# Patient Record
Sex: Female | Born: 1980 | Race: White | Hispanic: No | Marital: Married | Smoking: Never smoker
Health system: Southern US, Community
[De-identification: ages and names within clinical notes are randomized; demographics above are authoritative.]

## PROBLEM LIST (undated history)

## (undated) DIAGNOSIS — Z8614 Personal history of Methicillin resistant Staphylococcus aureus infection: Secondary | ICD-10-CM

## (undated) DIAGNOSIS — S52502A Unspecified fracture of the lower end of left radius, initial encounter for closed fracture: Secondary | ICD-10-CM

## (undated) HISTORY — PX: CHOLECYSTECTOMY: SHX55

## (undated) HISTORY — PX: WISDOM TOOTH EXTRACTION: SHX21

---
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
## 2000-01-02 ENCOUNTER — Other Ambulatory Visit: Admission: RE | Admit: 2000-01-02 | Discharge: 2000-01-02 | Payer: Self-pay | Admitting: Family Medicine

## 2000-01-10 ENCOUNTER — Ambulatory Visit (HOSPITAL_COMMUNITY): Admission: RE | Admit: 2000-01-10 | Discharge: 2000-01-10 | Payer: Self-pay | Admitting: Family Medicine

## 2000-01-10 ENCOUNTER — Encounter: Payer: Self-pay | Admitting: Family Medicine

## 2000-02-13 ENCOUNTER — Ambulatory Visit (HOSPITAL_COMMUNITY): Admission: RE | Admit: 2000-02-13 | Discharge: 2000-02-13 | Payer: Self-pay | Admitting: Family Medicine

## 2000-02-13 ENCOUNTER — Encounter: Payer: Self-pay | Admitting: Family Medicine

## 2000-03-28 ENCOUNTER — Ambulatory Visit (HOSPITAL_COMMUNITY): Admission: RE | Admit: 2000-03-28 | Discharge: 2000-03-28 | Payer: Self-pay | Admitting: Family Medicine

## 2000-03-28 ENCOUNTER — Encounter: Payer: Self-pay | Admitting: Family Medicine

## 2000-04-21 ENCOUNTER — Ambulatory Visit (HOSPITAL_COMMUNITY): Admission: RE | Admit: 2000-04-21 | Discharge: 2000-04-21 | Payer: Self-pay | Admitting: Family Medicine

## 2000-04-21 ENCOUNTER — Encounter: Payer: Self-pay | Admitting: Family Medicine

## 2000-05-12 ENCOUNTER — Encounter: Payer: Self-pay | Admitting: Family Medicine

## 2000-05-12 ENCOUNTER — Ambulatory Visit (HOSPITAL_COMMUNITY): Admission: RE | Admit: 2000-05-12 | Discharge: 2000-05-12 | Payer: Self-pay | Admitting: Family Medicine

## 2000-06-05 ENCOUNTER — Encounter: Payer: Self-pay | Admitting: Family Medicine

## 2000-06-05 ENCOUNTER — Encounter (HOSPITAL_COMMUNITY): Admission: RE | Admit: 2000-06-05 | Discharge: 2000-06-25 | Payer: Self-pay | Admitting: Family Medicine

## 2000-06-05 ENCOUNTER — Observation Stay (HOSPITAL_COMMUNITY): Admission: AD | Admit: 2000-06-05 | Discharge: 2000-06-06 | Payer: Self-pay | Admitting: Family Medicine

## 2000-06-06 ENCOUNTER — Encounter: Payer: Self-pay | Admitting: Family Medicine

## 2000-06-23 ENCOUNTER — Inpatient Hospital Stay (HOSPITAL_COMMUNITY): Admission: AD | Admit: 2000-06-23 | Discharge: 2000-06-26 | Payer: Self-pay | Admitting: Family Medicine

## 2001-06-24 HISTORY — PX: TUBAL LIGATION: SHX77

## 2001-07-15 ENCOUNTER — Other Ambulatory Visit: Admission: RE | Admit: 2001-07-15 | Discharge: 2001-07-15 | Payer: Self-pay | Admitting: Family Medicine

## 2001-12-04 ENCOUNTER — Ambulatory Visit (HOSPITAL_COMMUNITY): Admission: RE | Admit: 2001-12-04 | Discharge: 2001-12-04 | Payer: Self-pay | Admitting: Family Medicine

## 2001-12-04 ENCOUNTER — Encounter: Payer: Self-pay | Admitting: Family Medicine

## 2002-01-11 ENCOUNTER — Ambulatory Visit (HOSPITAL_COMMUNITY): Admission: RE | Admit: 2002-01-11 | Discharge: 2002-01-11 | Payer: Self-pay | Admitting: Family Medicine

## 2002-01-11 ENCOUNTER — Encounter: Payer: Self-pay | Admitting: Family Medicine

## 2002-06-05 ENCOUNTER — Inpatient Hospital Stay (HOSPITAL_COMMUNITY): Admission: AD | Admit: 2002-06-05 | Discharge: 2002-06-07 | Payer: Self-pay | Admitting: Family Medicine

## 2002-07-30 ENCOUNTER — Other Ambulatory Visit: Admission: RE | Admit: 2002-07-30 | Discharge: 2002-07-30 | Payer: Self-pay | Admitting: Family Medicine

## 2004-08-14 ENCOUNTER — Ambulatory Visit (HOSPITAL_COMMUNITY): Admission: RE | Admit: 2004-08-14 | Discharge: 2004-08-14 | Payer: Self-pay | Admitting: Family Medicine

## 2004-08-28 ENCOUNTER — Ambulatory Visit (HOSPITAL_COMMUNITY): Admission: RE | Admit: 2004-08-28 | Discharge: 2004-08-29 | Payer: Self-pay | Admitting: General Surgery

## 2006-02-15 ENCOUNTER — Emergency Department (HOSPITAL_COMMUNITY): Admission: EM | Admit: 2006-02-15 | Discharge: 2006-02-15 | Payer: Self-pay | Admitting: Emergency Medicine

## 2006-05-27 ENCOUNTER — Emergency Department (HOSPITAL_COMMUNITY): Admission: EM | Admit: 2006-05-27 | Discharge: 2006-05-27 | Payer: Self-pay | Admitting: Emergency Medicine

## 2006-10-12 ENCOUNTER — Emergency Department (HOSPITAL_COMMUNITY): Admission: EM | Admit: 2006-10-12 | Discharge: 2006-10-12 | Payer: Self-pay | Admitting: Emergency Medicine

## 2011-09-27 ENCOUNTER — Encounter: Payer: Self-pay | Admitting: Family Medicine

## 2011-09-27 ENCOUNTER — Ambulatory Visit (INDEPENDENT_AMBULATORY_CARE_PROVIDER_SITE_OTHER): Payer: Managed Care, Other (non HMO) | Admitting: Family Medicine

## 2011-09-27 VITALS — BP 114/82 | HR 91 | Resp 15 | Ht 66.0 in | Wt 236.0 lb

## 2011-09-27 DIAGNOSIS — F419 Anxiety disorder, unspecified: Secondary | ICD-10-CM | POA: Insufficient documentation

## 2011-09-27 DIAGNOSIS — G4700 Insomnia, unspecified: Secondary | ICD-10-CM | POA: Insufficient documentation

## 2011-09-27 DIAGNOSIS — F329 Major depressive disorder, single episode, unspecified: Secondary | ICD-10-CM | POA: Insufficient documentation

## 2011-09-27 DIAGNOSIS — F3289 Other specified depressive episodes: Secondary | ICD-10-CM

## 2011-09-27 DIAGNOSIS — F32A Depression, unspecified: Secondary | ICD-10-CM

## 2011-09-27 DIAGNOSIS — E669 Obesity, unspecified: Secondary | ICD-10-CM | POA: Insufficient documentation

## 2011-09-27 DIAGNOSIS — F411 Generalized anxiety disorder: Secondary | ICD-10-CM

## 2011-09-27 MED ORDER — CLONAZEPAM 0.5 MG PO TABS: 0.5000 mg | ORAL_TABLET | Freq: Two times a day (BID) | ORAL | Status: DC | PRN

## 2011-09-27 MED ORDER — ZOLPIDEM TARTRATE 10 MG PO TABS: 10.0000 mg | ORAL_TABLET | Freq: Every evening | ORAL | Status: DC | PRN

## 2011-09-27 MED ORDER — VENLAFAXINE HCL 37.5 MG PO TABS: 37.5000 mg | ORAL_TABLET | Freq: Two times a day (BID) | ORAL | Status: DC

## 2011-09-27 NOTE — Patient Instructions (Signed)
For your anxiety and nerves start the effexor twice a day Use the klonopin as needed if feeling overwhelmed AMbein 1 hour before bedtime for sleep Do not eat after midnight for your physical exam, labs  Will be drawn Schedule a Physical Exam within the next 4 weeks- will f/u medications at that time

## 2011-09-27 NOTE — Assessment & Plan Note (Signed)
We discussed that changing her diet will be the biggest thing is she has difficulty finding time to exercise. She will have labs done at her physical exam within the next few weeks and will obtain fasting lipid panel at that time

## 2011-09-27 NOTE — Assessment & Plan Note (Signed)
Likely multifactorial from swing shift as well as her depression/anxiety

## 2011-09-27 NOTE — Progress Notes (Signed)
  Subjective:    Patient ID: Claire Anderson, female    DOB: 12/23/1980, 31 y.o.   MRN: [id number]  HPI  Patient here to establish care. She has not had a previous PCP in greater than 3years. Medications and history reviewed  Depression/anxiety- she has a history of depression and anxiety. In the past she was treated for depression on Celexa and Zoloft a short period of time however this did not help. She did difficult time dealing with the death of her grandfather. Currently she feels very overwhelmed and feels like she needs medication to help her. 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 Her husband was hurt on the job and was also diagnosed with bipolar therefore is now not working and she is the sole breadwinner. They have 3 children she is also caring for as well as working full time. She never expected to have her take on so many responsibilities. She works swing shift therefore has difficulty sleeping. She does note that she has coffee at night and typically snacks while on the computer however takes her a few hours to fall asleep. Her mother and grandmother are helping to support them financially. She has tried Benadryl to help her sleep.  Obesity- in the past she was seen a weight loss physician and was on Avonex as well as hCG shot and a meal plan. She was 53 pounds before starting LPN school 2 years ago however gained it back in approx 1 year.   Overdue for Pap smear History of preeclampsia 13 years ago  Review of Systems   GEN- denies fatigue, fever, weight loss,weakness, recent illness HEENT- denies eye drainage, change in vision, nasal discharge, CVS- denies chest pain, palpitations RESP- denies SOB, cough, wheeze ABD- denies N/V, change in stools, abd pain GU- denies dysuria, hematuria, dribbling, incontinence MSK- denies joint pain, muscle aches, injury, +restless  legs Neuro- denies headache, dizziness, syncope, seizure activity       Objective:   Physical Exam GEN- NAD, alert and oriented x3, obese HEENT- PERRL, EOMI, non injected sclera, pink conjunctiva, MMM, oropharynx clear Neck- Supple, no thyromegaly CVS- RRR, no murmur RESP-CTAB EXT- No edema Pulses- Radial, DP- 2+ Psych- not depressed appearing, normal affect, not overly anxious appearing, normal thought process, normal speech, no apparent SI       Assessment & Plan:

## 2011-09-27 NOTE — Assessment & Plan Note (Signed)
Claire Anderson she is coping quite well the setting of having to take on so much responsibility in the past couple of years. We'll start Effexor to cover both depression and anxiety symptoms. Low-dose Klonopin to be used for bridge

## 2011-10-25 ENCOUNTER — Encounter: Payer: Self-pay | Admitting: Family Medicine

## 2011-10-25 ENCOUNTER — Other Ambulatory Visit (HOSPITAL_COMMUNITY)
Admission: RE | Admit: 2011-10-25 | Discharge: 2011-10-25 | Disposition: A | Discharge status: Home / Self Care | Payer: Managed Care, Other (non HMO) | Source: Ambulatory Visit | Attending: Family Medicine | Admitting: Family Medicine

## 2011-10-25 ENCOUNTER — Ambulatory Visit (INDEPENDENT_AMBULATORY_CARE_PROVIDER_SITE_OTHER): Payer: Managed Care, Other (non HMO) | Admitting: Family Medicine

## 2011-10-25 VITALS — BP 112/78 | HR 73 | Resp 16 | Ht 66.0 in | Wt 226.1 lb

## 2011-10-25 DIAGNOSIS — Z0000 Encounter for general adult medical examination without abnormal findings: Secondary | ICD-10-CM

## 2011-10-25 DIAGNOSIS — F419 Anxiety disorder, unspecified: Secondary | ICD-10-CM

## 2011-10-25 DIAGNOSIS — Z124 Encounter for screening for malignant neoplasm of cervix: Secondary | ICD-10-CM

## 2011-10-25 DIAGNOSIS — F32A Depression, unspecified: Secondary | ICD-10-CM

## 2011-10-25 DIAGNOSIS — E669 Obesity, unspecified: Secondary | ICD-10-CM

## 2011-10-25 DIAGNOSIS — F411 Generalized anxiety disorder: Secondary | ICD-10-CM

## 2011-10-25 DIAGNOSIS — F329 Major depressive disorder, single episode, unspecified: Secondary | ICD-10-CM

## 2011-10-25 DIAGNOSIS — F3289 Other specified depressive episodes: Secondary | ICD-10-CM

## 2011-10-25 DIAGNOSIS — Z01419 Encounter for gynecological examination (general) (routine) without abnormal findings: Secondary | ICD-10-CM | POA: Insufficient documentation

## 2011-10-25 DIAGNOSIS — Z23 Encounter for immunization: Secondary | ICD-10-CM

## 2011-10-25 LAB — CBC
HCT: 39.6 % (ref 36.0–46.0)
MCHC: 32.1 g/dL (ref 30.0–36.0)
MCV: 89.4 fL (ref 78.0–100.0)
Platelets: 183 10*3/uL (ref 150–400)
RDW: 13.2 % (ref 11.5–15.5)

## 2011-10-25 LAB — COMPREHENSIVE METABOLIC PANEL
ALT: 11 U/L (ref 0–35)
AST: 11 U/L (ref 0–37)
CO2: 27 mEq/L (ref 19–32)
Calcium: 8.8 mg/dL (ref 8.4–10.5)
Chloride: 103 mEq/L (ref 96–112)
Creat: 0.56 mg/dL (ref 0.50–1.10)
Sodium: 138 mEq/L (ref 135–145)
Total Protein: 6.4 g/dL (ref 6.0–8.3)

## 2011-10-25 LAB — LIPID PANEL
LDL Cholesterol: 72 mg/dL (ref 0–99)
Triglycerides: 101 mg/dL (ref <150)
VLDL: 20 mg/dL (ref 0–40)

## 2011-10-25 MED ORDER — CLONAZEPAM 0.5 MG PO TABS: 0.5000 mg | ORAL_TABLET | Freq: Three times a day (TID) | ORAL | Status: DC | PRN

## 2011-10-25 NOTE — Patient Instructions (Signed)
Get your blood work done fasting, we will call with results Given TDAP today Increase your klonopin to three times a day as needed Continue effexor  I recommend eye visit once a year I recommend dental visit every 6 months 30 minutes of exercise 5 days  A week F/U 3 months

## 2011-10-27 ENCOUNTER — Encounter: Payer: Self-pay | Admitting: Family Medicine

## 2011-10-27 NOTE — Assessment & Plan Note (Signed)
Mood improved, continue effexor

## 2011-10-27 NOTE — Progress Notes (Signed)
  Subjective:    Patient ID: Claire Anderson, female    DOB: Jul 09, 1980, 31 y.o.   MRN: [id number]  HPI  Pt here for GYN exam, PAP Smear done  LMP 1 week ago, married Meds reviewed Anxiety/depression- taking effexor, feels this has helped a lot, but she still has anxiety where at times she takes her klonopin three times a day or takes 2 at a time, she would like to know if this can be increased. She only uses ambien on her shift nights where her sleep is disturbed.    Review of Systems  GEN- denies fatigue, fever, weight loss,weakness, recent illness HEENT- denies eye drainage, change in vision, nasal discharge, CVS- denies chest pain, palpitations RESP- denies SOB, cough, wheeze ABD- denies N/V, change in stools, abd pain GU- denies dysuria, hematuria, dribbling, incontinence MSK- denies joint pain, muscle aches, injury Neuro- denies headache, dizziness, syncope, seizure activity Psych-+anxiety, +depression     Objective:   Physical Exam GEN- NAD, alert and orientedx 3, obese Neck- supple, no thyromegaly CVS-RRR, no murmur RESP-CTAB Breast- normal symmetry, no nipple inversion,no nipple drainage, no nodules or lumps felt Nodes- no axillary nodes GU- normal external genitalia, vaginal mucosa pink and moist, cervix visualized no growth, no blood form os, no discharge, no CMT, no ovarian masses, uterus normal size EXT- no edema Pulses-2+ radial and DP        Assessment & Plan:    GYN Exam- PAP Smear done, immunizations UTD- TDAP given  Labs to be done- including FLP

## 2011-10-27 NOTE — Assessment & Plan Note (Signed)
Improved but still has symptoms on very stressful days, will allow her to take TID

## 2011-10-27 NOTE — Assessment & Plan Note (Signed)
FLP, encouraged change in diet and activity Congratulated on weight loss

## 2011-11-26 ENCOUNTER — Other Ambulatory Visit: Payer: Self-pay | Admitting: Family Medicine

## 2011-11-27 ENCOUNTER — Other Ambulatory Visit: Payer: Self-pay | Admitting: Family Medicine

## 2011-12-11 ENCOUNTER — Other Ambulatory Visit: Payer: Self-pay | Admitting: Family Medicine

## 2012-01-23 ENCOUNTER — Other Ambulatory Visit: Payer: Self-pay | Admitting: Family Medicine

## 2012-01-31 ENCOUNTER — Ambulatory Visit (INDEPENDENT_AMBULATORY_CARE_PROVIDER_SITE_OTHER): Payer: Managed Care, Other (non HMO) | Admitting: Family Medicine

## 2012-01-31 ENCOUNTER — Encounter: Payer: Self-pay | Admitting: Family Medicine

## 2012-01-31 VITALS — BP 112/78 | HR 78 | Resp 16 | Ht 66.0 in | Wt 221.0 lb

## 2012-01-31 DIAGNOSIS — F3289 Other specified depressive episodes: Secondary | ICD-10-CM

## 2012-01-31 DIAGNOSIS — F411 Generalized anxiety disorder: Secondary | ICD-10-CM

## 2012-01-31 DIAGNOSIS — F32A Depression, unspecified: Secondary | ICD-10-CM

## 2012-01-31 DIAGNOSIS — F329 Major depressive disorder, single episode, unspecified: Secondary | ICD-10-CM

## 2012-01-31 DIAGNOSIS — E669 Obesity, unspecified: Secondary | ICD-10-CM

## 2012-01-31 DIAGNOSIS — F419 Anxiety disorder, unspecified: Secondary | ICD-10-CM

## 2012-01-31 MED ORDER — VENLAFAXINE HCL 75 MG PO TABS: 75.0000 mg | ORAL_TABLET | Freq: Two times a day (BID) | ORAL | Status: DC

## 2012-01-31 MED ORDER — PHENTERMINE HCL 37.5 MG PO TABS: 37.5000 mg | ORAL_TABLET | Freq: Every day | ORAL | Status: DC

## 2012-01-31 NOTE — Progress Notes (Signed)
  Subjective:    Patient ID: Claire Anderson, female    DOB: 1980-11-18, 31 y.o.   MRN: [id number]  HPI  PT here to f/u anxiety and weight.   Obesity- has lost 15lbs with diet, trying to increase activity, was on phentermine in the past, would like to retry Anxiety/depression- doing well until recently, her grandmother has been sick a lot in and out of hospital, her husband is having work-up for possible lung cancer, she is working and this is stressful and caring for children, she would like the effexor to be increased, feels it works for her    Review of Systems  GEN- denies fatigue, fever, weight loss,weakness, recent illness HEENT- denies eye drainage, change in vision, nasal discharge, CVS- denies chest pain, palpitations RESP- denies SOB, cough, wheeze ABD- denies N/V, change in stools, abd pain GU- denies dysuria, hematuria, dribbling, incontinence MSK- denies joint pain, muscle aches, injury Neuro- denies headache, dizziness, syncope, seizure activity      Objective:   Physical Exam GEN- NAD, alert and oriented x3 HEENT- PERRL, EOMI, non injected sclera, pink conjunctiva, MMM, oropharynx clear CVS- RRR, no murmur RESP-CTAB EXT- No edema Pulses- Radial, DP- 2+ Psych- normal affect and Mood, good eye contact, normal thought process and judgement       Assessment & Plan:

## 2012-01-31 NOTE — Patient Instructions (Signed)
For the effexor take 1 tablet in morning and 2 night x 1 week, then increase to 2 in morning and 2 at night When you run out get the new dose Start the phentermine in 2 weeks  F/U 6 weeks

## 2012-02-02 NOTE — Assessment & Plan Note (Signed)
Deteriorated in setting of recent events, titrate effexor to 75mg  BID see below instructions

## 2012-02-02 NOTE — Assessment & Plan Note (Signed)
Will plan to restart phentermine after her dose adjustment in Effexor, discussed SE including pulmonary, her weight also contributes to her depression but she is very motivated to lose weight

## 2012-02-02 NOTE — Assessment & Plan Note (Signed)
Pt declines therapy, has difficult with finances, no SI, increase effexor

## 2012-03-13 ENCOUNTER — Ambulatory Visit: Payer: Managed Care, Other (non HMO) | Admitting: Family Medicine

## 2012-03-18 ENCOUNTER — Other Ambulatory Visit: Payer: Self-pay | Admitting: Family Medicine

## 2012-03-27 ENCOUNTER — Ambulatory Visit: Payer: Managed Care, Other (non HMO) | Admitting: Family Medicine

## 2012-04-24 ENCOUNTER — Encounter: Payer: Self-pay | Admitting: Family Medicine

## 2012-04-24 ENCOUNTER — Ambulatory Visit (INDEPENDENT_AMBULATORY_CARE_PROVIDER_SITE_OTHER): Payer: Managed Care, Other (non HMO) | Admitting: Family Medicine

## 2012-04-24 VITALS — BP 110/80 | HR 82 | Resp 15 | Ht 66.0 in | Wt 221.0 lb

## 2012-04-24 DIAGNOSIS — F419 Anxiety disorder, unspecified: Secondary | ICD-10-CM

## 2012-04-24 DIAGNOSIS — F3289 Other specified depressive episodes: Secondary | ICD-10-CM

## 2012-04-24 DIAGNOSIS — F411 Generalized anxiety disorder: Secondary | ICD-10-CM

## 2012-04-24 DIAGNOSIS — F329 Major depressive disorder, single episode, unspecified: Secondary | ICD-10-CM

## 2012-04-24 DIAGNOSIS — G4700 Insomnia, unspecified: Secondary | ICD-10-CM

## 2012-04-24 DIAGNOSIS — E669 Obesity, unspecified: Secondary | ICD-10-CM

## 2012-04-24 DIAGNOSIS — F32A Depression, unspecified: Secondary | ICD-10-CM

## 2012-04-24 MED ORDER — VENLAFAXINE HCL 75 MG PO TABS: 75.0000 mg | ORAL_TABLET | Freq: Two times a day (BID) | ORAL | Status: DC

## 2012-04-24 MED ORDER — PHENTERMINE HCL 37.5 MG PO TABS: 37.5000 mg | ORAL_TABLET | Freq: Every day | ORAL | Status: DC

## 2012-04-24 MED ORDER — ALPRAZOLAM 0.5 MG PO TABS: ORAL_TABLET | ORAL | Status: DC

## 2012-04-24 NOTE — Assessment & Plan Note (Signed)
Improved, with use of melatonin, she is decreasing use of [place]

## 2012-04-24 NOTE — Assessment & Plan Note (Signed)
She is improving and handling situations much better. Scores have also improved Continue effexor at current dose

## 2012-04-24 NOTE — Patient Instructions (Signed)
Try xanax 1-2 tablets  Continue effexor Continue phentermine  Handout of foods  Continue your exercise and watching your diets  F./U 4 months

## 2012-04-24 NOTE — Progress Notes (Signed)
  Subjective:    Patient ID: Claire Anderson, female    DOB: [birth year], 31 y.o.   MRN: [id number]  HPI Patient here to follow chronic medical problems. Including depression anxiety insomnia no PCP. She states that the increased dose of Effexor is working well for her., And is not helping very much for her panic and anxiety attacks. She often has to take 2 tablets at once. She is sleeping pretty well. She is actually add melatonin therefore uses Ambien very rarely. She had a situation at work very well yesterday and was very proud of herself and was also noted by her charge nurse. She's been trying to watch her diet but has noticed that she has been eating to many sweets. She's also drinking more caffeine because of her shift work. She has lost 15 pounds since April and is disappointed in herself because she has not lost any further weight. She has been on phentermine for 6 weeks.   Review of Systems  GEN- denies fatigue, fever, weight loss,weakness, recent illness HEENT- denies eye drainage, change in vision, nasal discharge, CVS- denies chest pain, palpitations RESP- denies SOB, cough, wheeze ABD- denies N/V, change in stools, abd pain GU- denies dysuria, hematuria, dribbling, incontinence MSK- denies joint pain, muscle aches, injury Neuro- denies headache, dizziness, syncope, seizure activity      Objective:   Physical Exam  GEN- NAD, alert and oriented x3 CVS- RRR, no murmur RESP-CTAB EXT- No edema Pulses- Radial 2+ Psych- normal affect and Mood, good eye contact, normal thought process and judgement  PHQ-9 score 7      Assessment & Plan:

## 2012-04-24 NOTE — Assessment & Plan Note (Signed)
Weight down total of 15 lbs, exercises 3 times a week, will continue with phetermine Discussed cutting out soda, caffiene, junk food, adding fruit for sugar cravings

## 2012-04-24 NOTE — Assessment & Plan Note (Signed)
Change to Xanax for faster onset, 1-2 tablets as needed , she has been very compliant with medications

## 2012-06-21 ENCOUNTER — Other Ambulatory Visit: Payer: Self-pay | Admitting: Family Medicine

## 2012-06-22 ENCOUNTER — Other Ambulatory Visit: Payer: Self-pay | Admitting: Family Medicine

## 2012-07-07 ENCOUNTER — Emergency Department (HOSPITAL_COMMUNITY)
Admission: EM | Admit: 2012-07-07 | Discharge: 2012-07-07 | Disposition: A | Discharge status: Home / Self Care | Payer: Managed Care, Other (non HMO) | Attending: Emergency Medicine | Admitting: Emergency Medicine

## 2012-07-07 ENCOUNTER — Encounter (HOSPITAL_COMMUNITY): Payer: Self-pay | Admitting: *Deleted

## 2012-07-07 DIAGNOSIS — R0982 Postnasal drip: Secondary | ICD-10-CM | POA: Insufficient documentation

## 2012-07-07 DIAGNOSIS — J029 Acute pharyngitis, unspecified: Secondary | ICD-10-CM | POA: Insufficient documentation

## 2012-07-07 DIAGNOSIS — R509 Fever, unspecified: Secondary | ICD-10-CM | POA: Insufficient documentation

## 2012-07-07 DIAGNOSIS — R059 Cough, unspecified: Secondary | ICD-10-CM | POA: Insufficient documentation

## 2012-07-07 DIAGNOSIS — F411 Generalized anxiety disorder: Secondary | ICD-10-CM | POA: Insufficient documentation

## 2012-07-07 DIAGNOSIS — Z79899 Other long term (current) drug therapy: Secondary | ICD-10-CM | POA: Insufficient documentation

## 2012-07-07 DIAGNOSIS — Z8659 Personal history of other mental and behavioral disorders: Secondary | ICD-10-CM | POA: Insufficient documentation

## 2012-07-07 DIAGNOSIS — B349 Viral infection, unspecified: Secondary | ICD-10-CM

## 2012-07-07 DIAGNOSIS — B9789 Other viral agents as the cause of diseases classified elsewhere: Secondary | ICD-10-CM | POA: Insufficient documentation

## 2012-07-07 DIAGNOSIS — R05 Cough: Secondary | ICD-10-CM | POA: Insufficient documentation

## 2012-07-07 DIAGNOSIS — J3489 Other specified disorders of nose and nasal sinuses: Secondary | ICD-10-CM | POA: Insufficient documentation

## 2012-07-07 NOTE — ED Notes (Signed)
Flu-like sx x3 days.

## 2012-07-07 NOTE — ED Provider Notes (Signed)
Medical screening examination/treatment/procedure(s) were performed by non-physician practitioner and as supervising physician I was immediately available for consultation/collaboration.   Charles B. Bernette Mayers, MD 07/07/12 1257

## 2012-07-07 NOTE — ED Provider Notes (Signed)
History     CSN: [id number]  Arrival date & time 07/07/12  6578   First MD Initiated Contact with Patient 07/07/12 [phone]      Chief Complaint  Patient presents with  . Influenza    (Consider location/radiation/quality/duration/timing/severity/associated sxs/prior treatment) HPI Comments: + subj fever, NP cough, runny nose and sore throat.  "i think i have the flu".  Patient is a 32 y.o. female presenting with flu symptoms. The history is provided by the patient. No language interpreter was used.  Influenza This is a new problem. Episode onset: 3 days ago. The problem has been unchanged. Associated symptoms include coughing, a fever and a sore throat. Pertinent negatives include no chest pain, chills or rash. She has tried nothing for the symptoms.    Past Medical History  Diagnosis Date  . Depression   . Anxiety     Past Surgical History  Procedure Date  . Cholecystectomy   . Tubal ligation     Family History  Problem Relation Age of Onset  . Alcohol abuse Father   . Depression Mother   . Cancer Maternal Aunt     ovarian  . Hypertension Maternal Grandmother   . Hypertension Maternal Grandfather     History  Substance Use Topics  . Smoking status: Never Smoker   . Smokeless tobacco: Not on file  . Alcohol Use: No    OB History    Grav Para Term Preterm Abortions TAB SAB Ect Mult Living                  Review of Systems  Constitutional: Positive for fever. Negative for chills.  HENT: Positive for sore throat and postnasal drip. Negative for trouble swallowing.   Respiratory: Positive for cough. Negative for shortness of breath, wheezing and stridor.   Cardiovascular: Negative for chest pain.  Skin: Negative for rash.  All other systems reviewed and are negative.    Allergies  Review of patient's allergies indicates no known allergies.  Home Medications   Current Outpatient Rx  Name  Route  Sig  Dispense  Refill  . ALPRAZOLAM 0.5 MG PO TABS  Oral   Take 0.5-1 mg by mouth 3 (three) times daily as needed. Anxiety         . VITAMIN D 1000 UNITS PO TABS   Oral   Take 1,000 Units by mouth daily.         . ROBITUSSIN PEAK COLD DM PO   Oral   Take 2 capsules by mouth every 6 (six) hours as needed. Cold Symptoms         . MELATONIN 10 MG PO TABS   Oral   Take 10 mg by mouth at bedtime as needed. Sleep         . PHENTERMINE HCL 37.5 MG PO CAPS   Oral   Take 37.5 mg by mouth daily.         . VENLAFAXINE HCL 75 MG PO TABS   Oral   Take 1 tablet (75 mg total) by mouth 2 (two) times daily.   60 tablet   3   . VITAMIN B-12 500 MCG PO TABS   Oral   Take 500 mcg by mouth daily.         Marland Kitchen ZOLPIDEM TARTRATE 10 MG PO TABS   Oral   Take 10 mg by mouth at bedtime as needed. Sleep           BP 118/82  Pulse  110  Temp 98.2 F (36.8 C) (Oral)  Resp 20  Ht 5\' 5"  (1.651 m)  Wt 200 lb (90.719 kg)  BMI 33.28 kg/m2  SpO2 96%  LMP 06/22/2012  Physical Exam  Nursing note and vitals reviewed. Constitutional: She is oriented to person, place, and time. She appears well-developed and well-nourished. She is cooperative.  Non-toxic appearance. She does not have a sickly appearance. She does not appear ill. No distress.  HENT:  Head: Normocephalic and atraumatic.  Mouth/Throat: Uvula is midline and mucous membranes are normal. No uvula swelling. No oropharyngeal exudate, posterior oropharyngeal edema, posterior oropharyngeal erythema or tonsillar abscesses.  Eyes: EOM are normal.  Neck: Normal range of motion.  Cardiovascular: Regular rhythm.   Pulmonary/Chest: Effort normal and breath sounds normal.  Abdominal: Soft. She exhibits no distension. There is no tenderness.  Musculoskeletal: Normal range of motion.  Neurological: She is alert and oriented to person, place, and time.  Skin: Skin is warm and dry.  Psychiatric: She has a normal mood and affect. Judgment normal.    ED Course  Procedures (including  critical care time)   Labs Reviewed  RAPID STREP SCREEN   No results found.   1. Viral illness       MDM  Negative strep.  Tylenol or ibuprofen for fever or pain F/u with PCP prn,        Evalina Field, PA 07/07/12 1117

## 2012-07-20 ENCOUNTER — Other Ambulatory Visit: Payer: Self-pay | Admitting: Family Medicine

## 2012-08-28 ENCOUNTER — Ambulatory Visit: Payer: Managed Care, Other (non HMO) | Admitting: Family Medicine

## 2012-10-06 ENCOUNTER — Other Ambulatory Visit: Payer: Self-pay | Admitting: Family Medicine

## 2012-10-08 ENCOUNTER — Telehealth: Payer: Self-pay | Admitting: Family Medicine

## 2012-10-08 NOTE — Telephone Encounter (Signed)
Message left for patient

## 2012-10-08 NOTE — Telephone Encounter (Signed)
Please triage her, if URI, advise tylenol, ibuprofen, robitussin OTC Will need appt for further medications Also add allergy med if needed

## 2012-11-06 ENCOUNTER — Other Ambulatory Visit: Payer: Self-pay | Admitting: Family Medicine

## 2012-11-09 NOTE — Telephone Encounter (Signed)
Send 30 day supply only, add pt needs OV

## 2012-11-09 NOTE — Telephone Encounter (Signed)
Is this ok to refill?  

## 2012-12-01 ENCOUNTER — Telehealth: Payer: Self-pay | Admitting: Family Medicine

## 2012-12-01 ENCOUNTER — Ambulatory Visit (INDEPENDENT_AMBULATORY_CARE_PROVIDER_SITE_OTHER): Payer: Managed Care, Other (non HMO) | Admitting: Family Medicine

## 2012-12-01 VITALS — BP 130/70 | HR 68 | Temp 98.6°F | Resp 18 | Ht 65.0 in | Wt 225.0 lb

## 2012-12-01 DIAGNOSIS — E669 Obesity, unspecified: Secondary | ICD-10-CM

## 2012-12-01 DIAGNOSIS — F329 Major depressive disorder, single episode, unspecified: Secondary | ICD-10-CM

## 2012-12-01 DIAGNOSIS — F411 Generalized anxiety disorder: Secondary | ICD-10-CM

## 2012-12-01 DIAGNOSIS — F419 Anxiety disorder, unspecified: Secondary | ICD-10-CM

## 2012-12-01 DIAGNOSIS — F3289 Other specified depressive episodes: Secondary | ICD-10-CM

## 2012-12-01 DIAGNOSIS — R928 Other abnormal and inconclusive findings on diagnostic imaging of breast: Secondary | ICD-10-CM

## 2012-12-01 DIAGNOSIS — G4700 Insomnia, unspecified: Secondary | ICD-10-CM

## 2012-12-01 DIAGNOSIS — F32A Depression, unspecified: Secondary | ICD-10-CM

## 2012-12-01 DIAGNOSIS — N63 Unspecified lump in unspecified breast: Secondary | ICD-10-CM

## 2012-12-01 MED ORDER — PHENTERMINE HCL 37.5 MG PO CAPS: 37.5000 mg | ORAL_CAPSULE | Freq: Every day | ORAL | Status: DC

## 2012-12-01 MED ORDER — ALPRAZOLAM 0.5 MG PO TABS: 0.5000 mg | ORAL_TABLET | Freq: Three times a day (TID) | ORAL | Status: DC | PRN

## 2012-12-01 NOTE — Patient Instructions (Signed)
Continue xanax Continue phentermine Nutrition referral for weight loss F/U 4 weeks

## 2012-12-01 NOTE — Telephone Encounter (Signed)
Both meds xanax and phertamine called out today

## 2012-12-03 ENCOUNTER — Encounter: Payer: Self-pay | Admitting: Family Medicine

## 2012-12-03 DIAGNOSIS — N63 Unspecified lump in unspecified breast: Secondary | ICD-10-CM | POA: Insufficient documentation

## 2012-12-03 NOTE — Progress Notes (Signed)
  Subjective:    Patient ID: Claire Anderson, female    DOB: 1981-06-24, 32 y.o.   MRN: [id number]  HPI Pt here to f/u chronic medical problems. She stopped the effexor and phentermine a couple of months ago, because they were causing her insomnia. She tapered off the effexor. She has gained some weight back and wants to restart the phentermine. THings are better at home, her husband is working now. She has a little more time to concentrate on herself.  Felt a small bump on right breast 2 weeks before her period it was mildly tender, no drainage. After her cycle 1 week ago, it went away.   Review of Systems - per above  GEN- denies fatigue, fever, weight loss,weakness, recent illness HEENT- denies eye drainage, change in vision, nasal discharge, CVS- denies chest pain, palpitations RESP- denies SOB, cough, wheeze Neuro- denies headache, dizziness, syncope, seizure activity      Objective:   Physical Exam  GEN- NAD, alert and oriented x 3, obese Breast- normal symmetry, no nipple inversion,no nipple drainage, no nodules or lumps felt Nodes- no axillary nodes Psych- normal affect and mood        Assessment & Plan:

## 2012-12-03 NOTE — Assessment & Plan Note (Addendum)
I think she was benefiting from the effexor but she declines restarting Her weight is at the center of a lot of her depression, her self confidence is low due to her obesity Discussed eating habits, exercise which will also help her mood

## 2012-12-03 NOTE — Assessment & Plan Note (Signed)
Nothing felt today, normal examination, ? If this was a cyst due to hormonal changes, she will monitor, if this reoccurs ultrasound of breast

## 2012-12-03 NOTE — Assessment & Plan Note (Signed)
Discussed medication and SE, pt will restart phentermine

## 2012-12-03 NOTE — Assessment & Plan Note (Addendum)
Melatonin, rare use of ambien

## 2012-12-03 NOTE — Assessment & Plan Note (Signed)
Continues to have anxiety, see above, continues low dose benzo

## 2013-01-05 ENCOUNTER — Ambulatory Visit: Payer: Managed Care, Other (non HMO) | Admitting: Family Medicine

## 2013-01-15 ENCOUNTER — Ambulatory Visit: Payer: Managed Care, Other (non HMO) | Admitting: Family Medicine

## 2013-01-18 ENCOUNTER — Ambulatory Visit (INDEPENDENT_AMBULATORY_CARE_PROVIDER_SITE_OTHER): Payer: Managed Care, Other (non HMO) | Admitting: Family Medicine

## 2013-01-18 ENCOUNTER — Encounter: Payer: Self-pay | Admitting: Family Medicine

## 2013-01-18 VITALS — BP 118/70 | HR 78 | Temp 98.9°F | Resp 18 | Wt 214.0 lb

## 2013-01-18 DIAGNOSIS — F32A Depression, unspecified: Secondary | ICD-10-CM

## 2013-01-18 DIAGNOSIS — F329 Major depressive disorder, single episode, unspecified: Secondary | ICD-10-CM

## 2013-01-18 DIAGNOSIS — F419 Anxiety disorder, unspecified: Secondary | ICD-10-CM

## 2013-01-18 DIAGNOSIS — E669 Obesity, unspecified: Secondary | ICD-10-CM

## 2013-01-18 DIAGNOSIS — F411 Generalized anxiety disorder: Secondary | ICD-10-CM

## 2013-01-18 DIAGNOSIS — F3289 Other specified depressive episodes: Secondary | ICD-10-CM

## 2013-01-18 NOTE — Assessment & Plan Note (Signed)
Doing well off meds, continue to monitor

## 2013-01-18 NOTE — Assessment & Plan Note (Signed)
Stable off meds. ?

## 2013-01-18 NOTE — Progress Notes (Signed)
  Subjective:    Patient ID: Claire Anderson, female    DOB: 1980/12/13, 32 y.o.   MRN: [id number]  HPI  Pt here to f/u phentermine and obesity. Doing well on meds, was taking 1/2 tablet then moved up to 1 tablet daily, felt she did better if she took this every other day. Working out 3 days a week, watching her diet. Has lost 11lbs since last visit 4 weeks ago. No CP, HA, SOB, Dizziness. Anxiety doing well off medications, has used ambien 2-3 times since last visit, rarely uses benzo  Review of Systems - per above  GEN- denies fatigue, fever, +weight loss,weakness, recent illness CVS- denies chest pain, palpitations RESP- denies SOB, cough, wheeze Neuro- denies headache, dizziness, syncope, seizure activity       Objective:   Physical Exam  GEN-NAD,alert and oriented x 3, weight down 11lbs Psych- normal affect and mood      Assessment & Plan:

## 2013-01-18 NOTE — Patient Instructions (Addendum)
F/U 2 months   Goal- 15-20 pounds by next visit Nutrition Referral  Continue vitamins Try to get in at least 3-4 exercise days a week

## 2013-01-18 NOTE — Assessment & Plan Note (Signed)
Weight loss noted, continue to work on nutrition, referral placed Short term goals set F/U 6 weeks , increase activity to 4 days a week

## 2013-01-19 ENCOUNTER — Telehealth (HOSPITAL_COMMUNITY): Payer: Self-pay | Admitting: Dietician

## 2013-01-19 NOTE — Telephone Encounter (Signed)
Received referral via fax from [facility] for dx: obesity.

## 2013-01-19 NOTE — Telephone Encounter (Signed)
Called and left message on pt voicemail at [phone].

## 2013-01-21 NOTE — Telephone Encounter (Signed)
No response from previous contact attempt. Sent letter to pt home via [employer] Mail in attempt to contact pt to schedule appointment.  

## 2013-01-27 NOTE — Telephone Encounter (Signed)
Received message from [place] at [facility] left at [phone] inquiring about status of referral. Called back at 1227. Informed that pt has been contacted twice, but no response.

## 2013-01-28 NOTE — Telephone Encounter (Signed)
Pt has not responded to attempts to contact to schedule appointment. Referral filed.  

## 2013-02-08 ENCOUNTER — Telehealth (HOSPITAL_COMMUNITY): Payer: Self-pay | Admitting: Dietician

## 2013-02-08 NOTE — Telephone Encounter (Signed)
Returned call at 1253. Appointment scheduled for 02/15/13 at 1100.

## 2013-02-08 NOTE — Telephone Encounter (Signed)
Received message from pt left at [phone].

## 2013-02-15 ENCOUNTER — Telehealth (HOSPITAL_COMMUNITY): Payer: Self-pay | Admitting: Dietician

## 2013-02-15 NOTE — Telephone Encounter (Signed)
Pt was a no-show for appointment scheduled for 02/15/2013 at 1100. Sent letter to pt home notifying pt of no-show and requesting rescheduling appointment.

## 2013-03-01 ENCOUNTER — Telehealth: Payer: Self-pay | Admitting: Family Medicine

## 2013-03-01 MED ORDER — ALPRAZOLAM 0.5 MG PO TABS: 0.5000 mg | ORAL_TABLET | Freq: Three times a day (TID) | ORAL | Status: DC | PRN

## 2013-03-01 NOTE — Telephone Encounter (Signed)
Ok to refill 

## 2013-03-01 NOTE — Telephone Encounter (Signed)
Okay to refill? 

## 2013-03-01 NOTE — Telephone Encounter (Signed)
Xanax 0.5 mg tab 2 TID prn #90

## 2013-03-01 NOTE — Telephone Encounter (Signed)
Med phoned in °

## 2013-03-08 ENCOUNTER — Telehealth: Payer: Self-pay | Admitting: Family Medicine

## 2013-03-08 ENCOUNTER — Telehealth (HOSPITAL_COMMUNITY): Payer: Self-pay | Admitting: Dietician

## 2013-03-08 NOTE — Telephone Encounter (Signed)
Xanax 0.5 mg tab 2 TID prn #90 last rf 12/30/12

## 2013-03-08 NOTE — Telephone Encounter (Signed)
Refill DENIED! Pt already picked up RX, confirmed by pharmacy

## 2013-03-08 NOTE — Telephone Encounter (Signed)
Received message from [place] at [facility] on [phone] regarding status of referral.

## 2013-03-08 NOTE — Telephone Encounter (Signed)
Returned call at 1153. Informed that pt was a no-show for scheduled appointment on 02/15/13 at 1100.

## 2013-03-22 ENCOUNTER — Ambulatory Visit: Payer: Managed Care, Other (non HMO) | Admitting: Family Medicine

## 2013-04-06 ENCOUNTER — Telehealth: Payer: Self-pay | Admitting: Family Medicine

## 2013-04-06 MED ORDER — ALPRAZOLAM 0.5 MG PO TABS: 0.5000 mg | ORAL_TABLET | Freq: Three times a day (TID) | ORAL | Status: DC | PRN

## 2013-04-06 NOTE — Telephone Encounter (Signed)
Alprazolam 0.5 mg  Two tabs TID PRN  #90    Last refill 03/01/13  OK refill?

## 2013-04-06 NOTE — Telephone Encounter (Signed)
rx called in

## 2013-04-06 NOTE — Telephone Encounter (Signed)
Okay to refill, give 3  

## 2013-04-12 ENCOUNTER — Ambulatory Visit: Payer: Managed Care, Other (non HMO) | Admitting: Family Medicine

## 2013-04-29 ENCOUNTER — Other Ambulatory Visit: Payer: Self-pay

## 2013-05-03 ENCOUNTER — Ambulatory Visit: Payer: Managed Care, Other (non HMO) | Admitting: Family Medicine

## 2013-05-14 ENCOUNTER — Ambulatory Visit (INDEPENDENT_AMBULATORY_CARE_PROVIDER_SITE_OTHER): Payer: Managed Care, Other (non HMO) | Admitting: Family Medicine

## 2013-05-14 ENCOUNTER — Encounter: Payer: Self-pay | Admitting: Family Medicine

## 2013-05-14 VITALS — BP 110/80 | HR 80 | Temp 98.2°F | Resp 20 | Ht 64.5 in | Wt 202.0 lb

## 2013-05-14 DIAGNOSIS — E669 Obesity, unspecified: Secondary | ICD-10-CM

## 2013-05-14 DIAGNOSIS — F411 Generalized anxiety disorder: Secondary | ICD-10-CM

## 2013-05-14 DIAGNOSIS — F329 Major depressive disorder, single episode, unspecified: Secondary | ICD-10-CM

## 2013-05-14 DIAGNOSIS — F419 Anxiety disorder, unspecified: Secondary | ICD-10-CM

## 2013-05-14 MED ORDER — SERTRALINE HCL 50 MG PO TABS: 100.0000 mg | ORAL_TABLET | Freq: Every day | ORAL | Status: DC

## 2013-05-14 MED ORDER — PHENTERMINE HCL 37.5 MG PO CAPS: 37.5000 mg | ORAL_CAPSULE | Freq: Every day | ORAL | Status: DC

## 2013-05-14 NOTE — Patient Instructions (Signed)
Start zoloft 50mg  a day, increase to 2 tablets after 2 weeks Continue xanax Continue phentermine  F/U 4 weeks

## 2013-05-15 NOTE — Progress Notes (Signed)
  Subjective:    Patient ID: Claire Anderson, female    DOB: Oct 22, 1980, 32 y.o.   MRN: [id number]  HPI  Patient here follow up medications for obesity as well as her mood. She's been taking phentermine for the past 5 months. She's lost approximately 40 pounds on the medication. She has slacked on her exercise but is trying to maintain her diet. She recently received a new job at [employer] and anxious was will be good for her. She will no longer be on second shift. She says states that her nerves have been worse over the past couple weeks and she was to start a maintenance medication again. She does find herself having to use the Xanax daily and her sleep is still poor. She was tried on Celexa, Lexapro and Effexor in the past. Of note she is stop the Ambien as it was not helping with her sleep.   Review of Systems   GEN- denies fatigue, fever, weight loss,weakness, recent illness HEENT- denies eye drainage, change in vision, nasal discharge, CVS- denies chest pain, palpitations RESP- denies SOB, cough, wheeze Neuro- denies headache, dizziness, syncope, seizure activity      Objective:   Physical Exam  GEN-NAD, alert and oriented x 3, + weight loss Psych- tearful, no SI, not overly depressed or anxious appearing, good eye contact, normal speech      Assessment & Plan:

## 2013-05-15 NOTE — Assessment & Plan Note (Signed)
Per above continue Xanax will start Zoloft

## 2013-05-15 NOTE — Assessment & Plan Note (Signed)
Start Zoloft 50 mg titrating  To 100 mg

## 2013-05-15 NOTE — Assessment & Plan Note (Addendum)
Assessment significant weight loss with the phentermine. We will complete one more month ( 6 months total) and then she will discontinue and continue with her healthy eating and exercise

## 2013-06-14 ENCOUNTER — Ambulatory Visit: Payer: Managed Care, Other (non HMO) | Admitting: Family Medicine

## 2013-06-30 ENCOUNTER — Other Ambulatory Visit: Payer: Self-pay | Admitting: Family Medicine

## 2013-07-01 NOTE — Telephone Encounter (Signed)
RX called in .

## 2013-07-01 NOTE — Telephone Encounter (Signed)
Ok to refill 

## 2013-07-01 NOTE — Telephone Encounter (Signed)
ok 

## 2013-07-07 ENCOUNTER — Ambulatory Visit: Payer: Managed Care, Other (non HMO) | Admitting: Family Medicine

## 2014-11-04 ENCOUNTER — Encounter (HOSPITAL_COMMUNITY): Payer: Self-pay | Admitting: *Deleted

## 2014-11-04 ENCOUNTER — Emergency Department (HOSPITAL_COMMUNITY)
Admission: EM | Admit: 2014-11-04 | Discharge: 2014-11-04 | Disposition: A | Discharge status: Home / Self Care | Payer: Managed Care, Other (non HMO) | Attending: Emergency Medicine | Admitting: Emergency Medicine

## 2014-11-04 DIAGNOSIS — F419 Anxiety disorder, unspecified: Secondary | ICD-10-CM | POA: Insufficient documentation

## 2014-11-04 DIAGNOSIS — L0291 Cutaneous abscess, unspecified: Secondary | ICD-10-CM

## 2014-11-04 DIAGNOSIS — F329 Major depressive disorder, single episode, unspecified: Secondary | ICD-10-CM | POA: Insufficient documentation

## 2014-11-04 DIAGNOSIS — L02411 Cutaneous abscess of right axilla: Secondary | ICD-10-CM | POA: Insufficient documentation

## 2014-11-04 DIAGNOSIS — Z79899 Other long term (current) drug therapy: Secondary | ICD-10-CM | POA: Insufficient documentation

## 2014-11-04 MED ORDER — OXYCODONE-ACETAMINOPHEN 5-325 MG PO TABS
1.0000 | ORAL_TABLET | Freq: Once | ORAL | Status: AC
  Administered 2014-11-04: 1 via ORAL
  Filled 2014-11-04: qty 1

## 2014-11-04 MED ORDER — LIDOCAINE-EPINEPHRINE (PF) 1 %-1:200000 IJ SOLN
20.0000 mL | Freq: Once | INTRAMUSCULAR | Status: AC
  Administered 2014-11-04: 10 mL
  Filled 2014-11-04: qty 10

## 2014-11-04 NOTE — Discharge Instructions (Signed)
Abscess  Apply warm compresses to other areas.  DO NOT shave until abscess has resolved.  If you develop redness around abscess site, you need to have the wound checked and may need antibiotics.  An abscess is an infected area that contains a collection of pus and debris.It can occur in almost any part of the body. An abscess is also known as a furuncle or boil. CAUSES  An abscess occurs when tissue gets infected. This can occur from blockage of oil or sweat glands, infection of hair follicles, or a minor injury to the skin. As the body tries to fight the infection, pus collects in the area and creates pressure under the skin. This pressure causes pain. People with weakened immune systems have difficulty fighting infections and get certain abscesses more often.  SYMPTOMS Usually an abscess develops on the skin and becomes a painful mass that is red, warm, and tender. If the abscess forms under the skin, you may feel a moveable soft area under the skin. Some abscesses break open (rupture) on their own, but most will continue to get worse without care. The infection can spread deeper into the body and eventually into the bloodstream, causing you to feel ill.  DIAGNOSIS  Your caregiver will take your medical history and perform a physical exam. A sample of fluid may also be taken from the abscess to determine what is causing your infection. TREATMENT  Your caregiver may prescribe antibiotic medicines to fight the infection. However, taking antibiotics alone usually does not cure an abscess. Your caregiver may need to make a small cut (incision) in the abscess to drain the pus. In some cases, gauze is packed into the abscess to reduce pain and to continue draining the area. HOME CARE INSTRUCTIONS   Only take over-the-counter or prescription medicines for pain, discomfort, or fever as directed by your caregiver.  If you were prescribed antibiotics, take them as directed. Finish them even if you start to  feel better.  If gauze is used, follow your caregiver's directions for changing the gauze.  To avoid spreading the infection:  Keep your draining abscess covered with a bandage.  Wash your hands well.  Do not share personal care items, towels, or whirlpools with others.  Avoid skin contact with others.  Keep your skin and clothes clean around the abscess.  Keep all follow-up appointments as directed by your caregiver. SEEK MEDICAL CARE IF:   You have increased pain, swelling, redness, fluid drainage, or bleeding.  You have muscle aches, chills, or a general ill feeling.  You have a fever. MAKE SURE YOU:   Understand these instructions.  Will watch your condition.  Will get help right away if you are not doing well or get worse. Document Released: 03/20/2005 Document Revised: 12/10/2011 Document Reviewed: 08/23/2011 [facility] Patient Information 2015 [place], [place]. This information is not intended to replace advice given to you by your health care provider. Make sure you discuss any questions you have with your health care provider.

## 2014-11-04 NOTE — ED Provider Notes (Signed)
CSN: [id number]     Arrival date & time 11/04/14  0347 History   First MD Initiated Contact with Patient 11/04/14 0354     Chief Complaint  Patient presents with  . Abscess    boil under rigth armpit for 2 days     (Consider location/radiation/quality/duration/timing/severity/associated sxs/prior Treatment) HPI  This is a 34 year old female with no significant past medical history who presents with abscess in her right armpit. Patient reports 2 to three-day history of worsening pain and redness under her right arm. She does shave.  She states the pain at work got so intense that she had to leave. Current pain is 6 out of 10. No history of abscesses. Denies any fevers or other symptoms.  Past Medical History  Diagnosis Date  . Depression   . Anxiety    Past Surgical History  Procedure Laterality Date  . Cholecystectomy    . Tubal ligation     Family History  Problem Relation Age of Onset  . Alcohol abuse Father   . Depression Mother   . Cancer Maternal Aunt     ovarian  . Hypertension Maternal Grandmother   . Hypertension Maternal Grandfather    History  Substance Use Topics  . Smoking status: Never Smoker   . Smokeless tobacco: Not on file  . Alcohol Use: No   OB History    No data available     Review of Systems  Constitutional: Negative for fever.  Skin: Positive for color change.       Boil  All other systems reviewed and are negative.     Allergies  Review of patient's allergies indicates no known allergies.  Home Medications   Prior to Admission medications   Medication Sig Start Date End Date Taking? Authorizing Provider  ALPRAZolam Prudy Feeler(XANAX) 0.5 MG tablet TAKE TWO TABLETS BY MOUTH THREE TIMES DAILY AS NEEDED 06/30/13  Yes Salley ScarletKawanta F [place], MD  cholecalciferol (VITAMIN D) 1000 UNITS tablet Take 1,000 Units by mouth daily.   Yes Historical Provider, MD  clonazePAM (KLONOPIN) 1 MG tablet Take 1 mg by mouth at bedtime.   Yes Historical Provider, MD   Melatonin 10 MG TABS Take 10 mg by mouth at bedtime as needed. Sleep   Yes Historical Provider, MD  oxyCODONE (ROXICODONE) 15 MG immediate release tablet Take 30 mg by mouth every 4 (four) hours as needed for pain.    Yes Historical Provider, MD  rOPINIRole (REQUIP) 0.25 MG tablet Take 0.25 mg by mouth 3 (three) times daily.   Yes Historical Provider, MD  vitamin B-12 (CYANOCOBALAMIN) 500 MCG tablet Take 500 mcg by mouth daily.   Yes Historical Provider, MD  phentermine 37.5 MG capsule Take 1 capsule (37.5 mg total) by mouth daily. 05/14/13   Salley ScarletKawanta F [place], MD  sertraline (ZOLOFT) 50 MG tablet Take 2 tablets (100 mg total) by mouth daily. 05/14/13   Salley ScarletKawanta F [place], MD   BP 131/76 mmHg  Pulse 78  Temp(Src) 98.1 F (36.7 C) (Oral)  Resp 17  Ht 5' 4.5" (1.638 m)  Wt 200 lb (90.719 kg)  BMI 33.81 kg/m2  SpO2 99%  LMP 10/21/2014 Physical Exam  Constitutional: She is oriented to person, place, and time. She appears well-developed and well-nourished.  Overweight  HENT:  Head: Normocephalic and atraumatic.  Cardiovascular: Normal rate and regular rhythm.   Pulmonary/Chest: Effort normal. No respiratory distress.  Musculoskeletal: She exhibits no edema.  Neurological: She is alert and oriented to person, place, and time.  Skin: Skin is warm and dry.  3 small less than 0.5 cm areas of induration over the anterior aspect of the axilla, general follicular irritation noted without overt infection,significant surrounding erythema or fluctuance  Psychiatric: She has a normal mood and affect.  Nursing note and vitals reviewed.   ED Course  INCISION AND DRAINAGE Date/Time: 11/04/2014 4:59 AM Performed by: Shon BatonHORTON, COURTNEY F Authorized by: Shon BatonHORTON, COURTNEY F Consent: Verbal consent obtained. Risks and benefits: risks, benefits and alternatives were discussed Consent given by: patient Time out: Immediately prior to procedure a "time out" was called to verify the correct patient, procedure,  equipment, support staff and site/side marked as required. Type: abscess Body area: upper extremity (Right axilla) Anesthesia: local infiltration Local anesthetic: lidocaine 1% with epinephrine Anesthetic total: 5 ml Patient sedated: no Scalpel size: 11 Incision type: single straight Complexity: simple Drainage: purulent Drainage amount: scant Wound treatment: wound left open Patient tolerance: Patient tolerated the procedure well with no immediate complications    EMERGENCY DEPARTMENT US SOFT TISSUE INTERPRETATION "Study: Limited Ultrasound of the noted body part in comments below"  INDICATIONS: Pain Multiple views of the body part are obtained with a multi-frequency linear probe  PERFORMED BY:  Myself  IMAGES ARCHIVED?: No  SIDE:Right   BODY PART:Axilla  FINDINGS: Abcess present  LIMITATIONS:  Body Habitus  INTERPRETATION:  Abcess present  COMMENT:  0.5 cm fluid collection noted just under the skin, no features of adjacent cellulitis  (including critical care time) Labs Review Labs Reviewed - No data to display  Imaging Review No results found.   EKG Interpretation None      MDM   Final diagnoses:  Abscess    Patient presents with possible abscess of the right axilla. Nontoxic on exam. No overt signs of cellulitis. Her axilla is generally irritated secondary to shaving. Ultrasound shows a very small pocket of fluid under one of the indurated areas. Discuss with patient conservative management with warm soaks at home versus I&D. Patient has elected for incision and drainage. Incision and drainage performed with a small scant amount of purulent drainage. Patient instructed not to shave her armpits. She was given wound care precautions and return precautions.  After history, exam, and medical workup I feel the patient has been appropriately medically screened and is safe for discharge home. Pertinent diagnoses were discussed with the patient. Patient was given  return precautions.     Shon Batonourtney F Horton, MD 11/04/14 [phone]

## 2014-12-09 ENCOUNTER — Emergency Department (HOSPITAL_COMMUNITY)
Admission: EM | Admit: 2014-12-09 | Discharge: 2014-12-10 | Disposition: A | Discharge status: Home / Self Care | Payer: Managed Care, Other (non HMO) | Attending: Emergency Medicine | Admitting: Emergency Medicine

## 2014-12-09 ENCOUNTER — Encounter (HOSPITAL_COMMUNITY): Payer: Self-pay

## 2014-12-09 DIAGNOSIS — F329 Major depressive disorder, single episode, unspecified: Secondary | ICD-10-CM | POA: Insufficient documentation

## 2014-12-09 DIAGNOSIS — L02412 Cutaneous abscess of left axilla: Secondary | ICD-10-CM | POA: Insufficient documentation

## 2014-12-09 DIAGNOSIS — L02411 Cutaneous abscess of right axilla: Secondary | ICD-10-CM | POA: Insufficient documentation

## 2014-12-09 DIAGNOSIS — F419 Anxiety disorder, unspecified: Secondary | ICD-10-CM | POA: Insufficient documentation

## 2014-12-09 DIAGNOSIS — Z79899 Other long term (current) drug therapy: Secondary | ICD-10-CM | POA: Insufficient documentation

## 2014-12-09 NOTE — ED Notes (Signed)
Pt reports she has 2 possible abcesses to under both armpits, denies drainage at this time, states one under right armpit has been lanced about a month ago.

## 2014-12-10 MED ORDER — SULFAMETHOXAZOLE-TRIMETHOPRIM 800-160 MG PO TABS: 1.0000 | ORAL_TABLET | Freq: Two times a day (BID) | ORAL | Status: AC

## 2014-12-10 MED ORDER — LIDOCAINE HCL (PF) 1 % IJ SOLN
INTRAMUSCULAR | Status: AC
  Filled 2014-12-10: qty 10

## 2014-12-10 MED ORDER — HYDROCODONE-ACETAMINOPHEN 5-325 MG PO TABS
1.0000 | ORAL_TABLET | Freq: Once | ORAL | Status: AC
  Administered 2014-12-10: 1 via ORAL
  Filled 2014-12-10: qty 1

## 2014-12-10 MED ORDER — HYDROCODONE-ACETAMINOPHEN 5-325 MG PO TABS: 1.0000 | ORAL_TABLET | ORAL | Status: DC | PRN

## 2014-12-10 MED ORDER — SULFAMETHOXAZOLE-TRIMETHOPRIM 800-160 MG PO TABS
1.0000 | ORAL_TABLET | Freq: Once | ORAL | Status: AC
  Administered 2014-12-10: 1 via ORAL
  Filled 2014-12-10: qty 1

## 2014-12-10 NOTE — ED Notes (Signed)
Covered wounds under bilateral armpits with sterile gauze. Patient tolerated well.

## 2014-12-10 NOTE — Discharge Instructions (Signed)
Abscess Care After An abscess (also called a boil or furuncle) is an infected area that contains a collection of pus. Signs and symptoms of an abscess include pain, tenderness, redness, or hardness, or you may feel a moveable soft area under your skin. An abscess can occur anywhere in the body. The infection may spread to surrounding tissues causing cellulitis. A cut (incision) by the surgeon was made over your abscess and the pus was drained out. Gauze may have been packed into the space to provide a drain that will allow the cavity to heal from the inside outwards. The boil may be painful for 5 to 7 days. Most people with a boil do not have high fevers. Your abscess, if seen early, may not have localized, and may not have been lanced. If not, another appointment may be required for this if it does not get better on its own or with medications. HOME CARE INSTRUCTIONS   Only take over-the-counter or prescription medicines for pain, discomfort, or fever as directed by your caregiver.  When you bathe, soak and then remove gauze or iodoform packs 3 times daily . You may then wash the wound gently with mild soapy water.  SEEK IMMEDIATE MEDICAL CARE IF:   You develop increased pain, swelling, redness, drainage, or bleeding in the wound site.  You develop signs of generalized infection including muscle aches, chills, fever, or a general ill feeling.  An oral temperature above 102 F (38.9 C) develops, not controlled by medication. See your caregiver for a recheck if you develop any of the symptoms described above. If medications (antibiotics) were prescribed, take them as directed. Document Released: 12/27/2004 Document Revised: 09/02/2011 Document Reviewed: 08/24/2007 [facility] Patient Information 2015 [place], [place]. This information is not intended to replace advice given to you by your health care provider. Make sure you discuss any questions you have with your health care provider.   Take your  entire course of the antibiotic.  Do not drive within 4 hours of taking hydrocodone as this medication will make you drowsy.  As discussed, consider avoiding shaving and tell these infections are healed.  I recommend switching to a spray rather than roll-on deodorant which may be worsening your symptoms.  However, avoid deodorant directly at the abscess sites until these are healed.  I recommend an antibiotic tearing of soap such as Dial.

## 2014-12-10 NOTE — ED Provider Notes (Signed)
CSN: [id number]     Arrival date & time 12/09/14  2303 History   First MD Initiated Contact with Patient 12/09/14 2335     Chief Complaint  Patient presents with  . Recurrent Skin Infections     (Consider location/radiation/quality/duration/timing/severity/associated sxs/prior Treatment) The history is provided by the patient and the spouse.   Claire Anderson is a 34 y.o. female presenting with recurrence of the abscess in her right axilla which was I & D'd here last month, additionally, has developed similar abscess in the left axilla with several surrounding small "pimples" . These site have not been draining, but are quite painful with pressure or palpation.  She denies fevers or chills and denies any prior problems with abscess or skin infections until this past month. She works as a Engineer, civil (consulting) in a Nurse, adult home. She has used warm shower soaks and gentle squeezing without relief or drainage.     Past Medical History  Diagnosis Date  . Depression   . Anxiety    Past Surgical History  Procedure Laterality Date  . Cholecystectomy    . Tubal ligation     Family History  Problem Relation Age of Onset  . Alcohol abuse Father   . Depression Mother   . Cancer Maternal Aunt     ovarian  . Hypertension Maternal Grandmother   . Hypertension Maternal Grandfather    History  Substance Use Topics  . Smoking status: Never Smoker   . Smokeless tobacco: Not on file  . Alcohol Use: No   OB History    No data available     Review of Systems  Constitutional: Negative for fever and chills.  Respiratory: Negative for shortness of breath and wheezing.   Skin:       Negative except as mentioned in HPI.    Neurological: Negative for numbness.      Allergies  Review of patient's allergies indicates no known allergies.  Home Medications   Prior to Admission medications   Medication Sig Start Date End Date Taking? Authorizing Provider  ALPRAZolam Prudy Feeler) 0.5 MG tablet TAKE TWO  TABLETS BY MOUTH THREE TIMES DAILY AS NEEDED 06/30/13  Yes Salley Scarlet, MD  cholecalciferol (VITAMIN D) 1000 UNITS tablet Take 1,000 Units by mouth daily.   Yes Historical Provider, MD  clonazePAM (KLONOPIN) 1 MG tablet Take 1 mg by mouth at bedtime.   Yes Historical Provider, MD  oxyCODONE (ROXICODONE) 15 MG immediate release tablet Take 30 mg by mouth every 4 (four) hours as needed for pain.    Yes Historical Provider, MD  rOPINIRole (REQUIP) 0.25 MG tablet Take 0.25 mg by mouth 3 (three) times daily.   Yes Historical Provider, MD  vitamin B-12 (CYANOCOBALAMIN) 500 MCG tablet Take 500 mcg by mouth daily.   Yes Historical Provider, MD  HYDROcodone-acetaminophen (NORCO/VICODIN) 5-325 MG per tablet Take 1 tablet by mouth every 4 (four) hours as needed. 12/10/14   Burgess Amor, PA-C  Melatonin 10 MG TABS Take 10 mg by mouth at bedtime as needed. Sleep    Historical Provider, MD  phentermine 37.5 MG capsule Take 1 capsule (37.5 mg total) by mouth daily. 05/14/13   Salley Scarlet, MD  sertraline (ZOLOFT) 50 MG tablet Take 2 tablets (100 mg total) by mouth daily. 05/14/13   Salley Scarlet, MD  sulfamethoxazole-trimethoprim (BACTRIM DS,SEPTRA DS) 800-160 MG per tablet Take 1 tablet by mouth 2 (two) times daily. 12/10/14 12/17/14  Burgess Amor, PA-C   BP 109/59  mmHg  Pulse 66  Temp(Src) 97.7 F (36.5 C) (Oral)  Resp 20  Ht 5' 4.5" (1.638 m)  Wt 199 lb (90.266 kg)  BMI 33.64 kg/m2  SpO2 100%  LMP 12/04/2014 Physical Exam  Constitutional: She appears well-developed and well-nourished. No distress.  HENT:  Head: Normocephalic.  Neck: Neck supple.  Cardiovascular: Normal rate.   Pulmonary/Chest: Effort normal. She has no wheezes.  Musculoskeletal: Normal range of motion. She exhibits no edema.  Skin:  2 cm indurated abscess in bilateral axillary crease.  No drainage, no surrounding erythema.  Left axilla also has several small papules surrounding the main abscess.      ED Course  Procedures  (including critical care time)  INCISION AND DRAINAGE  RIGHT AXILLA Performed by: Burgess Amor Consent: Verbal consent obtained. Risks and benefits: risks, benefits and alternatives were discussed Type: abscess  Body area: right axilla  Anesthesia: local infiltration  Incision was made with a scalpel.  Local anesthetic: lidocaine 1% without epinephrine  Anesthetic total: 2 ml  Complexity: complex Blunt dissection to break up loculations  Drainage: purulent  Drainage amount: moderate  Packing material: none Patient tolerance: Patient tolerated the procedure well with no immediate complications.   INCISION AND DRAINAGE   Left Axilla Performed by: Burgess Amor Consent: Verbal consent obtained. Risks and benefits: risks, benefits and alternatives were discussed Type: abscess  Body area: left axilla Anesthesia: local infiltration  Incision was made with a scalpel.  Local anesthetic: lidocaine 1% without epinephrine  Anesthetic total: 1.5 ml  Complexity: complex Blunt dissection to break up loculations  Drainage: purulent  Drainage amount: small  Packing material: none  Patient tolerance: Patient tolerated the procedure well with no immediate complications.       Labs Review Labs Reviewed  CULTURE, ROUTINE-ABSCESS    Imaging Review No results found.   EKG Interpretation None      MDM   Final diagnoses:  Abscess of axilla, left  Abscess of axilla, right    Pt advised warm soaks bid to tid, keep covered.  Prescribed bactrim, hydrocodone.  Abscess culture sent.  Plan f/u with pcp or return here for persistent or worsened sx.  The patient appears reasonably screened and/or stabilized for discharge and I doubt any other medical condition or other [facility] requiring further screening, evaluation, or treatment in the ED at this time prior to discharge.     Burgess Amor, PA-C 12/10/14 1428  Zadie Rhine, MD 12/10/14 [phone]

## 2014-12-13 ENCOUNTER — Telehealth (HOSPITAL_BASED_OUTPATIENT_CLINIC_OR_DEPARTMENT_OTHER): Payer: Self-pay | Admitting: Emergency Medicine

## 2014-12-13 LAB — CULTURE, ROUTINE-ABSCESS: Gram Stain: NONE SEEN

## 2014-12-14 ENCOUNTER — Telehealth (HOSPITAL_BASED_OUTPATIENT_CLINIC_OR_DEPARTMENT_OTHER): Payer: Self-pay | Admitting: Emergency Medicine

## 2014-12-14 NOTE — Telephone Encounter (Signed)
Post ED Visit - Positive Culture Follow-up  Culture report reviewed by antimicrobial stewardship pharmacist: []  Wes Dulaney, Pharm.D., BCPS []  Celedonio Miyamoto, Pharm.D., BCPS [x]  Georgina Pillion, Pharm.D., BCPS []  [place], [address].D., BCPS, AAHIVP []  Estella Husk, Pharm.D., BCPS, AAHIVP []  Elder Cyphers, [address].D., BCPS  Positive abcess  Culture MRSA Treated with bactrim DS, organism sensitive to the same and no further patient follow-up is required at this time.  Berle Mull 12/14/2014, 2:56 PM

## 2014-12-15 ENCOUNTER — Encounter (HOSPITAL_COMMUNITY): Payer: Self-pay | Admitting: *Deleted

## 2014-12-15 ENCOUNTER — Emergency Department (HOSPITAL_COMMUNITY)
Admission: EM | Admit: 2014-12-15 | Discharge: 2014-12-15 | Disposition: A | Discharge status: Home / Self Care | Payer: Managed Care, Other (non HMO) | Attending: Emergency Medicine | Admitting: Emergency Medicine

## 2014-12-15 DIAGNOSIS — Z5189 Encounter for other specified aftercare: Secondary | ICD-10-CM

## 2014-12-15 DIAGNOSIS — F329 Major depressive disorder, single episode, unspecified: Secondary | ICD-10-CM | POA: Insufficient documentation

## 2014-12-15 DIAGNOSIS — Z79899 Other long term (current) drug therapy: Secondary | ICD-10-CM | POA: Insufficient documentation

## 2014-12-15 DIAGNOSIS — Z4801 Encounter for change or removal of surgical wound dressing: Secondary | ICD-10-CM | POA: Insufficient documentation

## 2014-12-15 DIAGNOSIS — F419 Anxiety disorder, unspecified: Secondary | ICD-10-CM | POA: Insufficient documentation

## 2014-12-15 DIAGNOSIS — Z792 Long term (current) use of antibiotics: Secondary | ICD-10-CM | POA: Insufficient documentation

## 2014-12-15 NOTE — ED Notes (Addendum)
Pt reports that she was called about culture results from cyst from right axilla. States that she was told to come to fast track for reevaluation.  States that she called out from work tonight and will need a work note.

## 2014-12-15 NOTE — ED Notes (Signed)
   12/15/14 2100  Skin Color/Condition  Skin Color/Condition (WDL) X  Skin Color Appropriate for ethnicity  Skin Integrity Intact  Pt has abscesses in bilateral arm pits and is currently on regimen of antibiotics for the same. Pt states someone called her today with positive mrsa culture report.

## 2014-12-15 NOTE — ED Provider Notes (Signed)
CSN: [id number]     Arrival date & time 12/15/14  1913 History   First MD Initiated Contact with Patient 12/15/14 2133     Chief Complaint  Patient presents with  . Wound Check     (Consider location/radiation/quality/duration/timing/severity/associated sxs/prior Treatment) HPI Comments: Pt presented to the ED on 12/10/2014 for eval of bilat axilla abscess area. I and D was carried out on the right. Culture revealed MRSA. Pt wanted to be rechecked while on Bactrim.  Patient is a 34 y.o. female presenting with wound check. The history is provided by the patient.  Wound Check This is a new problem. The current episode started in the past 7 days. The problem occurs constantly. The problem has been gradually improving. Associated symptoms include swollen glands. Pertinent negatives include no fatigue, fever, nausea or vomiting. Exacerbated by: palpation. Treatments tried: antibiotics and warm tub soaks. The treatment provided significant relief.    Past Medical History  Diagnosis Date  . Depression   . Anxiety    Past Surgical History  Procedure Laterality Date  . Cholecystectomy    . Tubal ligation     Family History  Problem Relation Age of Onset  . Alcohol abuse Father   . Depression Mother   . Cancer Maternal Aunt     ovarian  . Hypertension Maternal Grandmother   . Hypertension Maternal Grandfather    History  Substance Use Topics  . Smoking status: Never Smoker   . Smokeless tobacco: Not on file  . Alcohol Use: No   OB History    No data available     Review of Systems  Constitutional: Negative for fever and fatigue.  Gastrointestinal: Negative for nausea and vomiting.  Skin: Positive for wound.  Psychiatric/Behavioral: The patient is nervous/anxious.   All other systems reviewed and are negative.     Allergies  Review of patient's allergies indicates no known allergies.  Home Medications   Prior to Admission medications   Medication Sig Start Date End  Date Taking? Authorizing Provider  ALPRAZolam (XANAX) 0.5 MG tablet TAKE TWO TABLETS BY MOUTH THREE TIMES DAILY AS NEEDED Patient taking differently: TAKE TWO TABLETS BY MOUTH THREE TIMES DAILY AS NEEDED ANXIETY 06/30/13  Yes Salley Scarlet, MD  b complex vitamins tablet Take 1 tablet by mouth daily.   Yes Historical Provider, MD  Biotin 1 MG CAPS Take 1 mg by mouth daily.   Yes Historical Provider, MD  clonazePAM (KLONOPIN) 1 MG tablet Take 1 mg by mouth at bedtime.   Yes Historical Provider, MD  lisdexamfetamine (VYVANSE) 70 MG capsule Take 70 mg by mouth daily.   Yes Historical Provider, MD  Multiple Vitamins-Calcium (ONE-A-DAY WOMENS PO) Take 1 tablet by mouth daily.   Yes Historical Provider, MD  oxycodone (ROXICODONE) 30 MG immediate release tablet Take 30 mg by mouth 4 (four) times daily.   Yes Historical Provider, MD  rOPINIRole (REQUIP) 0.25 MG tablet Take 0.25 mg by mouth 3 (three) times daily.   Yes Historical Provider, MD  sulfamethoxazole-trimethoprim (BACTRIM DS,SEPTRA DS) 800-160 MG per tablet Take 1 tablet by mouth 2 (two) times daily. 12/10/14 12/17/14 Yes Raynelle Fanning Idol, PA-C  vitamin B-12 (CYANOCOBALAMIN) 500 MCG tablet Take 500 mcg by mouth daily.   Yes Historical Provider, MD  Vitamin D, Ergocalciferol, (DRISDOL) 50000 UNITS CAPS capsule Take 50,000 Units by mouth every 7 (seven) days.   Yes Historical Provider, MD  HYDROcodone-acetaminophen (NORCO/VICODIN) 5-325 MG per tablet Take 1 tablet by mouth every 4 (four)  hours as needed. Patient not taking: Reported on 12/15/2014 12/10/14   Burgess Amor, PA-C  phentermine 37.5 MG capsule Take 1 capsule (37.5 mg total) by mouth daily. Patient not taking: Reported on 12/15/2014 05/14/13   Salley Scarlet, MD  sertraline (ZOLOFT) 50 MG tablet Take 2 tablets (100 mg total) by mouth daily. Patient not taking: Reported on 12/15/2014 05/14/13   Salley Scarlet, MD   BP 116/66 mmHg  Pulse 77  Temp(Src) 98.4 F (36.9 C) (Oral)  Resp 18  Ht   (1.626 m)  Wt 199 lb (90.266 kg)  BMI 34.14 kg/m2  SpO2 100%  LMP 12/04/2014 Physical Exam  Constitutional: She is oriented to person, place, and time. She appears well-developed and well-nourished.  Non-toxic appearance.  HENT:  Head: Normocephalic.  Right Ear: Tympanic membrane and external ear normal.  Left Ear: Tympanic membrane and external ear normal.  Eyes: EOM and lids are normal. Pupils are equal, round, and reactive to light.  Neck: Normal range of motion. Neck supple. Carotid bruit is not present.  Cardiovascular: Normal rate, regular rhythm, normal heart sounds, intact distal pulses and normal pulses.   Pulmonary/Chest: Breath sounds normal. No respiratory distress.  Abdominal: Soft. Bowel sounds are normal. There is no tenderness. There is no guarding.  Musculoskeletal: Normal range of motion.  Wound to the right axilla is nearly sealed and healing nicely. No red streaks. Mild to mod tenderness present. FROM of the right upper extremity. No temp changes. Radial pulse 2+. Cap refill less than 2 sec.Marland Kitchen  Lymphadenopathy:       Head (right side): No submandibular adenopathy present.       Head (left side): No submandibular adenopathy present.    She has no cervical adenopathy.  Neurological: She is alert and oriented to person, place, and time. She has normal strength. No cranial nerve deficit or sensory deficit.  Skin: Skin is warm and dry.  Psychiatric: She has a normal mood and affect. Her speech is normal.  Nursing note and vitals reviewed.   ED Course  Procedures (including critical care time) Labs Review Labs Reviewed - No data to display  Imaging Review No results found.   EKG Interpretation None      MDM  Discussed MRSA with the patient. Pt on bactrim. I have reviewed the cultures, and organism is sensative to bactrim. Pt to continue the warm tub soaks and finish the bactrim. She will return if any changes or problem.   Final diagnoses:  None    **I  have reviewed nursing notes, vital signs, and all appropriate lab and imaging results for this patient.Ivery Quale, PA-C 12/16/14 1023  Donnetta Hutching, MD 12/16/14 [phone]

## 2014-12-15 NOTE — Discharge Instructions (Signed)
Please continue warm tub soaks. Please finish your Septra and biotic. Please see Dr. Charm Barges, or return to the emergency department if any signs of advancing or return infection. Wound Check Your wound appears healthy today. Your wound will heal gradually over time. Eventually a scar will form that will fade with time. FACTORS THAT AFFECT SCAR FORMATION:  People differ in the severity in which they scar.  Scar severity varies according to location, size, and the traits you inherited from your parents (genetic predisposition).  Irritation to the wound from infection, rubbing, or chemical exposure will increase the amount of scar formation. HOME CARE INSTRUCTIONS   If you were given a dressing, you should change it at least once a day or as instructed by your caregiver. If the bandage sticks, soak it off with a solution of hydrogen peroxide.  If the bandage becomes wet, dirty, or develops a bad smell, change it as soon as possible.  Look for signs of infection.  Only take over-the-counter or prescription medicines for pain, discomfort, or fever as directed by your caregiver. SEEK IMMEDIATE MEDICAL CARE IF:   You have redness, swelling, or increasing pain in the wound.  You notice pus coming from the wound.  You have a fever.  You notice a bad smell coming from the wound or dressing. Document Released: 03/16/2004 Document Revised: 09/02/2011 Document Reviewed: 06/10/2005 [facility] Patient Information 2015 [place], [place]. This information is not intended to replace advice given to you by your health care provider. Make sure you discuss any questions you have with your health care provider.

## 2016-05-08 ENCOUNTER — Emergency Department (HOSPITAL_COMMUNITY)
Admission: EM | Admit: 2016-05-08 | Discharge: 2016-05-08 | Disposition: A | Discharge status: Home / Self Care | Payer: Managed Care, Other (non HMO)

## 2016-05-08 NOTE — ED Notes (Signed)
Called x 1 no answer

## 2016-06-20 ENCOUNTER — Emergency Department (HOSPITAL_COMMUNITY)
Admission: EM | Admit: 2016-06-20 | Discharge: 2016-06-21 | Disposition: A | Discharge status: Home / Self Care | Payer: Self-pay | Attending: Emergency Medicine | Admitting: Emergency Medicine

## 2016-06-20 ENCOUNTER — Encounter (HOSPITAL_COMMUNITY): Payer: Self-pay | Admitting: Emergency Medicine

## 2016-06-20 DIAGNOSIS — R109 Unspecified abdominal pain: Secondary | ICD-10-CM | POA: Insufficient documentation

## 2016-06-20 DIAGNOSIS — R61 Generalized hyperhidrosis: Secondary | ICD-10-CM | POA: Insufficient documentation

## 2016-06-20 DIAGNOSIS — R110 Nausea: Secondary | ICD-10-CM | POA: Insufficient documentation

## 2016-06-20 DIAGNOSIS — R197 Diarrhea, unspecified: Secondary | ICD-10-CM | POA: Insufficient documentation

## 2016-06-20 DIAGNOSIS — R55 Syncope and collapse: Secondary | ICD-10-CM | POA: Insufficient documentation

## 2016-06-20 DIAGNOSIS — R51 Headache: Secondary | ICD-10-CM | POA: Insufficient documentation

## 2016-06-20 LAB — CBC
HCT: 43 % (ref 36.0–46.0)
HEMOGLOBIN: 14 g/dL (ref 12.0–15.0)
MCH: 29.2 pg (ref 26.0–34.0)
MCHC: 32.6 g/dL (ref 30.0–36.0)
MCV: 89.8 fL (ref 78.0–100.0)
Platelets: 255 10*3/uL (ref 150–400)
RBC: 4.79 MIL/uL (ref 3.87–5.11)
RDW: 13.6 % (ref 11.5–15.5)
WBC: 12 10*3/uL — ABNORMAL HIGH (ref 4.0–10.5)

## 2016-06-20 LAB — COMPREHENSIVE METABOLIC PANEL
ALT: 22 U/L (ref 14–54)
AST: 19 U/L (ref 15–41)
Albumin: 4.1 g/dL (ref 3.5–5.0)
Alkaline Phosphatase: 87 U/L (ref 38–126)
Anion gap: 6 (ref 5–15)
BUN: 12 mg/dL (ref 6–20)
CHLORIDE: 103 mmol/L (ref 101–111)
CO2: 27 mmol/L (ref 22–32)
Calcium: 9.4 mg/dL (ref 8.9–10.3)
Creatinine, Ser: 0.73 mg/dL (ref 0.44–1.00)
GFR calc non Af Amer: >60 mL/min (ref >60)
Glucose, Bld: 106 mg/dL — ABNORMAL HIGH (ref 65–99)
Potassium: 4.4 mmol/L (ref 3.5–5.1)
SODIUM: 136 mmol/L (ref 135–145)
Total Bilirubin: 0.5 mg/dL (ref 0.3–1.2)
Total Protein: 7.7 g/dL (ref 6.5–8.1)

## 2016-06-20 LAB — URINALYSIS, ROUTINE W REFLEX MICROSCOPIC
Bilirubin Urine: NEGATIVE
Glucose, UA: NEGATIVE mg/dL
Ketones, ur: NEGATIVE mg/dL
LEUKOCYTES UA: NEGATIVE
Nitrite: NEGATIVE
Specific Gravity, Urine: >1.03 — ABNORMAL HIGH (ref 1.005–1.030)
pH: 6 (ref 5.0–8.0)

## 2016-06-20 LAB — URINALYSIS, MICROSCOPIC (REFLEX)

## 2016-06-20 LAB — CBG MONITORING, ED: GLUCOSE-CAPILLARY: 100 mg/dL — AB (ref 65–99)

## 2016-06-20 LAB — LIPASE, BLOOD: LIPASE: 26 U/L (ref 11–51)

## 2016-06-20 NOTE — ED Provider Notes (Signed)
[facility]-EMERGENCY DEPT Provider Note   CSN: [id number] Arrival date & time: 06/20/16  2044  By signing my name below, I, Majel HomerPeyton Lee, attest that this documentation has been prepared under the direction and in the presence of Pricilla LovelessScott Adeli Frost, MD . Electronically Signed: Majel HomerPeyton Lee, Scribe. 06/20/2016. 12:04 AM.  History   Chief Complaint Chief Complaint  Patient presents with  . Fatigue   The history is provided by the patient. No language interpreter was used.   HPI Comments: Claire Anderson is a 35 y.o. female who presents to the Emergency Department for an evaluation s/p episode of near-syncope that began at ~8:45 PM this evening. Pt reports she was walking at work when she suddenly began to experience lightheaded with associated diaphoresis, nausea, "flushing of her cheeks." She states she visited the ED and began to experience a "throbbing," 5/10, headache while sitting in the waiting room. She notes this episode lasted for ~1 hour. She notes her symptoms have improved now in the ED without any pain medication. Pt also states multiple episodes of diarrhea and mild abdominal pain yesterday which is unusual for her. She denies recent illness, abdominal pain, vomiting, palpitations, weakness on one side of her body, dysuria, difficulty urinating, and vaginal bleeding. She states she still has normal menstrual cycles.   Past Medical History:  Diagnosis Date  . Anxiety   . Depression    Patient Active Problem List   Diagnosis Date Noted  . Breast nodule 12/03/2012  . MDD (major depressive disorder) 09/27/2011  . Anxiety 09/27/2011  . Obesity 09/27/2011  . Insomnia 09/27/2011   Past Surgical History:  Procedure Laterality Date  . CHOLECYSTECTOMY    . TUBAL LIGATION      OB History    No data available     Home Medications    Prior to Admission medications   Medication Sig Start Date End Date Taking? Authorizing Provider  ALPRAZolam (XANAX) 0.5 MG tablet TAKE TWO TABLETS BY  MOUTH THREE TIMES DAILY AS NEEDED Patient taking differently: TAKE TWO TABLETS BY MOUTH THREE TIMES DAILY AS NEEDED ANXIETY 06/30/13   Salley ScarletKawanta F [place], MD  b complex vitamins tablet Take 1 tablet by mouth daily.    Historical Provider, MD  Biotin 1 MG CAPS Take 1 mg by mouth daily.    Historical Provider, MD  clonazePAM (KLONOPIN) 1 MG tablet Take 1 mg by mouth at bedtime.    Historical Provider, MD  HYDROcodone-acetaminophen (NORCO/VICODIN) 5-325 MG per tablet Take 1 tablet by mouth every 4 (four) hours as needed. Patient not taking: Reported on 12/15/2014 12/10/14   Burgess AmorJulie Idol, PA-C  lisdexamfetamine (VYVANSE) 70 MG capsule Take 70 mg by mouth daily.    Historical Provider, MD  Multiple Vitamins-Calcium (ONE-A-DAY WOMENS PO) Take 1 tablet by mouth daily.    Historical Provider, MD  oxycodone (ROXICODONE) 30 MG immediate release tablet Take 30 mg by mouth 4 (four) times daily.    Historical Provider, MD  phentermine 37.5 MG capsule Take 1 capsule (37.5 mg total) by mouth daily. Patient not taking: Reported on 12/15/2014 05/14/13   Salley ScarletKawanta F [place], MD  rOPINIRole (REQUIP) 0.25 MG tablet Take 0.25 mg by mouth 3 (three) times daily.    Historical Provider, MD  sertraline (ZOLOFT) 50 MG tablet Take 2 tablets (100 mg total) by mouth daily. Patient not taking: Reported on 12/15/2014 05/14/13   Salley ScarletKawanta F [place], MD  vitamin B-12 (CYANOCOBALAMIN) 500 MCG tablet Take 500 mcg by mouth daily.  Historical Provider, MD  Vitamin D, Ergocalciferol, (DRISDOL) 50000 UNITS CAPS capsule Take 50,000 Units by mouth every 7 (seven) days.    Historical Provider, MD    Family History Family History  Problem Relation Age of Onset  . Alcohol abuse Father   . Depression Mother   . Cancer Maternal Aunt     ovarian  . Hypertension Maternal Grandmother   . Hypertension Maternal Grandfather     Social History Social History  Substance Use Topics  . Smoking status: Never Smoker  . Smokeless tobacco: Never Used  .  Alcohol use No     Allergies   Patient has no known allergies.   Review of Systems Review of Systems  Constitutional: Positive for diaphoresis.  Cardiovascular: Negative for palpitations.  Gastrointestinal: Positive for abdominal pain, diarrhea and nausea. Negative for vomiting.  Genitourinary: Negative for difficulty urinating, dysuria and vaginal bleeding.  Neurological: Positive for light-headedness and headaches. Negative for weakness.  All other systems reviewed and are negative.  Physical Exam Updated Vital Signs BP 108/72 (BP Location: Left Arm)   Pulse 85   Temp 98.6 F (37 C) (Oral)   Resp 18   Ht 5\' 4"  (1.626 m)   Wt 199 lb (90.3 kg)   SpO2 100%   BMI 34.16 kg/m   Physical Exam  Constitutional: She is oriented to person, place, and time. She appears well-developed and well-nourished.  HENT:  Head: Normocephalic and atraumatic.  Right Ear: External ear normal.  Left Ear: External ear normal.  Nose: Nose normal.  Eyes: EOM are normal. Pupils are equal, round, and reactive to light. Right eye exhibits no discharge. Left eye exhibits no discharge.  Neck: Normal range of motion. Neck supple.  Cardiovascular: Normal rate, regular rhythm and normal heart sounds.   No murmur heard. Pulmonary/Chest: Effort normal and breath sounds normal.  Abdominal: Soft. There is no tenderness.  Neurological: She is alert and oriented to person, place, and time.  CN 3-12 grossly intact. 5/5 strength in all 4 extremities. Grossly normal sensation. Normal finger to nose.   Skin: Skin is warm and dry.  Nursing note and vitals reviewed.  ED Treatments / Results  Labs (all labs ordered are listed, but only abnormal results are displayed) Labs Reviewed  COMPREHENSIVE METABOLIC PANEL - Abnormal; Notable for the following:       Result Value   Glucose, Bld 106 (*)    All other components within normal limits  CBC - Abnormal; Notable for the following:    WBC 12.0 (*)    All other  components within normal limits  URINALYSIS, ROUTINE W REFLEX MICROSCOPIC - Abnormal; Notable for the following:    Specific Gravity, Urine >1.030 (*)    Hgb urine dipstick TRACE (*)    Protein, ur TRACE (*)    All other components within normal limits  URINALYSIS, MICROSCOPIC (REFLEX) - Abnormal; Notable for the following:    Bacteria, UA MANY (*)    Squamous Epithelial / LPF TOO NUMEROUS TO COUNT (*)    All other components within normal limits  CBG MONITORING, ED - Abnormal; Notable for the following:    Glucose-Capillary 100 (*)    All other components within normal limits  LIPASE, BLOOD  PREGNANCY, URINE  CBG MONITORING, ED    EKG  EKG Interpretation  Date/Time:  Friday June 21 2016 00:42:33 EST Ventricular Rate:  67 PR Interval:    QRS Duration: 87 QT Interval:  402 QTC Calculation: 425 R Axis:  23 Text Interpretation:  Normal sinus rhythm LAE, consider biatrial enlargement Probable anterior infarct, old No old tracing to compare Confirmed by Jesten Cappuccio MD, Tychelle Purkey [phone]) on 06/21/2016 1:02:21 AM       Radiology Ct Head Wo Contrast  Result Date: 06/21/2016 CLINICAL DATA:  Sudden onset of headache.  Near syncope. EXAM: CT HEAD WITHOUT CONTRAST TECHNIQUE: Contiguous axial images were obtained from the base of the skull through the vertex without intravenous contrast. COMPARISON:  None. FINDINGS: Brain: No evidence of acute infarction, hemorrhage, hydrocephalus, extra-axial collection or mass lesion/mass effect. Vascular: No hyperdense vessel or unexpected calcification. Skull: Normal. Negative for fracture or focal lesion. Sinuses/Orbits: Tiny mucous retention cyst and sphenoid sinus. Paranasal sinuses are otherwise clear. Mastoid air cells are well-aerated. Visualized orbits are unremarkable. Other: None. IMPRESSION: No acute intracranial abnormality. Electronically Signed   By: Rubye Oaks M.D.   On: 06/21/2016 00:36    Procedures Procedures (including critical  care time)  Medications Ordered in ED Medications  sodium chloride 0.9 % bolus 1,000 mL (0 mLs Intravenous Stopped 06/21/16 0227)  acetaminophen (TYLENOL) tablet 1,000 mg (1,000 mg Oral Given 06/21/16 0142)    DIAGNOSTIC STUDIES:  Oxygen Saturation is 100% on RA, normal by my interpretation.    COORDINATION OF CARE:  12:01 AM Discussed treatment plan with pt at bedside and pt agreed to plan.  Initial Impression / Assessment and Plan / ED Course  I have reviewed the triage vital signs and the nursing notes.  Pertinent labs & imaging results that were available during my care of the patient were reviewed by me and considered in my medical decision making (see chart for details).  Clinical Course as of Jun 21 642  Fri Jun 21, 2016  0002 Given acute sudden HA at 9 pm, will get CT although that's probably related to near-syncope. Probably from decreased intake and diarrhea.  [SG]    Clinical Course User Index [SG] Pricilla Loveless, MD    Workup unremarkable. Likely from not eating this evening and her increased stools/diarrhea. Feels better with fluids. No signs of arrhythmia, doubt cardiac cause. HA more likely from dehydration but given severity at onset CT obtained and negative within 4 hours. Urine appears contaminated, no symptoms of UTI. Continue fluids at home. Discussed return precautions.  Final Clinical Impressions(s) / ED Diagnoses   Final diagnoses:  Near syncope    New Prescriptions Discharge Medication List as of 06/21/2016  1:57 AM       Pricilla Loveless, MD 06/21/16 [phone]

## 2016-06-20 NOTE — ED Triage Notes (Signed)
Pt C/O of sweating and feeling flush while at work around 2045. Pt denies fever, N/V/D. Pt states he face "feels like it is on fire."

## 2016-06-21 ENCOUNTER — Emergency Department (HOSPITAL_COMMUNITY): Payer: Self-pay

## 2016-06-21 LAB — PREGNANCY, URINE: Preg Test, Ur: NEGATIVE

## 2016-06-21 LAB — CBG MONITORING, ED: Glucose-Capillary: 93 mg/dL (ref 65–99)

## 2016-06-21 MED ORDER — ACETAMINOPHEN 500 MG PO TABS
1000.0000 mg | ORAL_TABLET | Freq: Once | ORAL | Status: AC
  Administered 2016-06-21: 1000 mg via ORAL
  Filled 2016-06-21: qty 2

## 2016-06-21 MED ORDER — SODIUM CHLORIDE 0.9 % IV BOLUS (SEPSIS)
1000.0000 mL | Freq: Once | INTRAVENOUS | Status: AC
  Administered 2016-06-21: 1000 mL via INTRAVENOUS

## 2018-04-16 DIAGNOSIS — S52502A Unspecified fracture of the lower end of left radius, initial encounter for closed fracture: Secondary | ICD-10-CM

## 2018-04-16 HISTORY — DX: Unspecified fracture of the lower end of left radius, initial encounter for closed fracture: S52.502A

## 2018-04-18 ENCOUNTER — Emergency Department (HOSPITAL_COMMUNITY)
Admission: EM | Admit: 2018-04-18 | Discharge: 2018-04-18 | Disposition: A | Discharge status: Home / Self Care | Payer: Self-pay | Attending: Emergency Medicine | Admitting: Emergency Medicine

## 2018-04-18 ENCOUNTER — Emergency Department (HOSPITAL_COMMUNITY): Payer: Self-pay

## 2018-04-18 ENCOUNTER — Encounter (HOSPITAL_COMMUNITY): Payer: Self-pay

## 2018-04-18 DIAGNOSIS — W19XXXA Unspecified fall, initial encounter: Secondary | ICD-10-CM | POA: Insufficient documentation

## 2018-04-18 DIAGNOSIS — Y998 Other external cause status: Secondary | ICD-10-CM | POA: Insufficient documentation

## 2018-04-18 DIAGNOSIS — S52572A Other intraarticular fracture of lower end of left radius, initial encounter for closed fracture: Secondary | ICD-10-CM | POA: Insufficient documentation

## 2018-04-18 DIAGNOSIS — Z79899 Other long term (current) drug therapy: Secondary | ICD-10-CM | POA: Insufficient documentation

## 2018-04-18 DIAGNOSIS — Y939 Activity, unspecified: Secondary | ICD-10-CM | POA: Insufficient documentation

## 2018-04-18 DIAGNOSIS — Y929 Unspecified place or not applicable: Secondary | ICD-10-CM | POA: Insufficient documentation

## 2018-04-18 MED ORDER — OXYCODONE-ACETAMINOPHEN 5-325 MG PO TABS
1.0000 | ORAL_TABLET | Freq: Once | ORAL | Status: AC
  Administered 2018-04-18: 1 via ORAL
  Filled 2018-04-18: qty 1

## 2018-04-18 MED ORDER — OXYCODONE-ACETAMINOPHEN 5-325 MG PO TABS: 1.0000 | ORAL_TABLET | Freq: Four times a day (QID) | ORAL | 0 refills | Status: AC | PRN

## 2018-04-18 NOTE — ED Triage Notes (Signed)
Pt fell on Tuesday and caught herself with her left hand.  Pt having pain and bruising to the left wrist and up forearm.

## 2018-04-18 NOTE — ED Provider Notes (Signed)
Emergency Department Provider Note   I have reviewed the triage vital signs and the nursing notes.   HISTORY  Chief Complaint Wrist Injury   HPI Claire Anderson is a 37 y.o. female who presents to the emergency department today which is 2 days after she fell on her outstretched left hand causing significant swelling and pain to her left wrist.  Also with ecchymosis going up the dorsal side of her arm as well which is seems to be the main area of the pain.  No pain anywhere else.  No trauma anywhere else.  Has not seen anyone prior to this. OTC pain meds not helping much.  No other associated or modifying symptoms.    Past Medical History:  Diagnosis Date  . Anxiety   . Depression     Patient Active Problem List   Diagnosis Date Noted  . Breast nodule 12/03/2012  . MDD (major depressive disorder) 09/27/2011  . Anxiety 09/27/2011  . Obesity 09/27/2011  . Insomnia 09/27/2011    Past Surgical History:  Procedure Laterality Date  . CHOLECYSTECTOMY    . TUBAL LIGATION      Current Outpatient Rx  . Order #: [id number] Class: Phone In  . Order #: [id number] Class: Historical Med  . Order #: [id number] Class: Historical Med  . Order #: [id number] Class: Historical Med  . Order #: [id number] Class: Print  . Order #: [id number] Class: Historical Med  . Order #: [id number] Class: Historical Med  . Order #: [id number] Class: Historical Med  . Order #: [id number] Class: Print  . Order #: [id number] Class: Print  . Order #: [id number] Class: Historical Med  . Order #: [id number] Class: Normal  . Order #: [id number] Class: Historical Med  . Order #: [id number] Class: Historical Med    Allergies Patient has no known allergies.  Family History  Problem Relation Age of Onset  . Alcohol abuse Father   . Depression Mother   . Cancer Maternal Aunt        ovarian  . Hypertension Maternal Grandmother   . Hypertension Maternal Grandfather     Social History Social History   Tobacco Use  . Smoking  status: Never Smoker  . Smokeless tobacco: Never Used  Substance Use Topics  . Alcohol use: No  . Drug use: No    Review of Systems  All other systems negative except as documented in the HPI. All pertinent positives and negatives as reviewed in the HPI. ____________________________________________   PHYSICAL EXAM:  VITAL SIGNS: ED Triage Vitals  Enc Vitals Group     BP 04/18/18 0341 127/64     Pulse Rate 04/18/18 0341 65     Resp 04/18/18 0341 15     Temp 04/18/18 0341 97.8 F (36.6 C)     Temp Source 04/18/18 0341 Oral     SpO2 04/18/18 0341 100 %     Weight 04/18/18 0339 250 lb (113.4 kg)     Height 04/18/18 0339 5\' 7"  (1.702 m)    Constitutional: Alert and oriented. Well appearing and in no acute distress. Eyes: Conjunctivae are normal. PERRL. EOMI. Head: Atraumatic. Nose: No congestion/rhinnorhea. Mouth/Throat: Mucous membranes are moist.  Oropharynx non-erythematous. Neck: No stridor.  No meningeal signs.   Cardiovascular: Normal rate, regular rhythm. Good peripheral circulation. Grossly normal heart sounds.   Respiratory: Normal respiratory effort.  No retractions. Lungs CTAB. Gastrointestinal: Soft and nontender. No distention.  Musculoskeletal: No lower extremity tenderness nor edema. No gross deformities of extremities. Swelling, ecchymosis over whole volar surface of left wrist, NVI distally.  No pain in hand or proximal forearm.  Neurologic:  Normal speech and language. No gross focal neurologic deficits are appreciated.  Skin:  Skin is warm, dry and intact. No rash noted.   ____________________________________________   RADIOLOGY  Dg Wrist Complete Left  Result Date: 04/18/2018 CLINICAL DATA:  Fall 3 days ago, pain and bruising. EXAM: LEFT WRIST - COMPLETE 3+ VIEW COMPARISON:  None. FINDINGS: Acute comminuted nondisplaced distal radial fracture with intra-articular extension. No dislocation. No destructive bony lesions. Soft tissue swelling without  subcutaneous gas or radiopaque foreign bodies. IMPRESSION: 1. Acute nondisplaced distal radial fracture.  No dislocation. Electronically Signed   By: Awilda Metro M.D.   On: 04/18/2018 04:11    ____________________________________________   INITIAL IMPRESSION / ASSESSMENT AND PLAN / ED COURSE  xr to eval for fractures.   xr with distal radius fracture extending to joint. Sugar tong/pain medication and will follow up with hand surgery.  Pertinent labs & imaging results that were available during my care of the patient were reviewed by me and considered in my medical decision making (see chart for details).  ____________________________________________  FINAL CLINICAL IMPRESSION(S) / ED DIAGNOSES  Final diagnoses:  Other closed intra-articular fracture of distal end of left radius, initial encounter     MEDICATIONS GIVEN DURING THIS VISIT:  Medications  oxyCODONE-acetaminophen (PERCOCET/ROXICET) 5-325 MG per tablet 1 tablet (1 tablet Oral Given 04/18/18 0429)     NEW OUTPATIENT MEDICATIONS STARTED DURING THIS VISIT:  Discharge Medication List as of 04/18/2018  4:18 AM    START taking these medications   Details  oxyCODONE-acetaminophen (PERCOCET) 5-325 MG tablet Take 1-2 tablets by mouth every 6 (six) hours as needed for severe pain., Starting Sat 04/18/2018, Print        Note:  This note was prepared with assistance of Dragon voice recognition software. Occasional wrong-word or sound-a-like substitutions may have occurred due to the inherent limitations of voice recognition software.   Marily Memos, MD 04/18/18 [phone]

## 2018-04-23 ENCOUNTER — Other Ambulatory Visit (HOSPITAL_COMMUNITY): Payer: Self-pay | Admitting: Orthopedic Surgery

## 2018-04-23 DIAGNOSIS — S52572A Other intraarticular fracture of lower end of left radius, initial encounter for closed fracture: Secondary | ICD-10-CM

## 2018-04-29 ENCOUNTER — Ambulatory Visit (HOSPITAL_COMMUNITY)
Admission: RE | Admit: 2018-04-29 | Discharge: 2018-04-29 | Disposition: A | Discharge status: Home / Self Care | Payer: Self-pay | Source: Ambulatory Visit | Attending: Orthopedic Surgery | Admitting: Orthopedic Surgery

## 2018-04-29 DIAGNOSIS — W010XXA Fall on same level from slipping, tripping and stumbling without subsequent striking against object, initial encounter: Secondary | ICD-10-CM | POA: Insufficient documentation

## 2018-04-29 DIAGNOSIS — S52572A Other intraarticular fracture of lower end of left radius, initial encounter for closed fracture: Secondary | ICD-10-CM

## 2018-04-29 DIAGNOSIS — R937 Abnormal findings on diagnostic imaging of other parts of musculoskeletal system: Secondary | ICD-10-CM | POA: Insufficient documentation

## 2018-04-29 DIAGNOSIS — S52502A Unspecified fracture of the lower end of left radius, initial encounter for closed fracture: Secondary | ICD-10-CM | POA: Insufficient documentation

## 2018-04-29 DIAGNOSIS — Y92009 Unspecified place in unspecified non-institutional (private) residence as the place of occurrence of the external cause: Secondary | ICD-10-CM | POA: Insufficient documentation

## 2018-05-08 ENCOUNTER — Other Ambulatory Visit: Payer: Self-pay | Admitting: Orthopedic Surgery

## 2018-05-08 ENCOUNTER — Other Ambulatory Visit: Payer: Self-pay

## 2018-05-08 ENCOUNTER — Encounter (HOSPITAL_BASED_OUTPATIENT_CLINIC_OR_DEPARTMENT_OTHER): Payer: Self-pay | Admitting: *Deleted

## 2018-05-12 ENCOUNTER — Ambulatory Visit (HOSPITAL_BASED_OUTPATIENT_CLINIC_OR_DEPARTMENT_OTHER)
Admission: RE | Admit: 2018-05-12 | Discharge: 2018-05-12 | Disposition: A | Discharge status: Home / Self Care | Payer: Self-pay | Source: Ambulatory Visit | Attending: Orthopedic Surgery | Admitting: Orthopedic Surgery

## 2018-05-12 ENCOUNTER — Encounter (HOSPITAL_BASED_OUTPATIENT_CLINIC_OR_DEPARTMENT_OTHER)
Admission: RE | Disposition: A | Discharge status: Home / Self Care | Payer: Self-pay | Source: Ambulatory Visit | Attending: Orthopedic Surgery

## 2018-05-12 ENCOUNTER — Other Ambulatory Visit: Payer: Self-pay

## 2018-05-12 ENCOUNTER — Ambulatory Visit (HOSPITAL_BASED_OUTPATIENT_CLINIC_OR_DEPARTMENT_OTHER): Payer: Self-pay | Admitting: Anesthesiology

## 2018-05-12 ENCOUNTER — Encounter (HOSPITAL_BASED_OUTPATIENT_CLINIC_OR_DEPARTMENT_OTHER): Payer: Self-pay | Admitting: Certified Registered Nurse Anesthetist

## 2018-05-12 ENCOUNTER — Ambulatory Visit (HOSPITAL_COMMUNITY): Payer: Self-pay

## 2018-05-12 DIAGNOSIS — Z6841 Body Mass Index (BMI) 40.0 and over, adult: Secondary | ICD-10-CM | POA: Insufficient documentation

## 2018-05-12 DIAGNOSIS — X58XXXA Exposure to other specified factors, initial encounter: Secondary | ICD-10-CM | POA: Insufficient documentation

## 2018-05-12 DIAGNOSIS — Z8614 Personal history of Methicillin resistant Staphylococcus aureus infection: Secondary | ICD-10-CM | POA: Insufficient documentation

## 2018-05-12 DIAGNOSIS — S52572A Other intraarticular fracture of lower end of left radius, initial encounter for closed fracture: Secondary | ICD-10-CM | POA: Insufficient documentation

## 2018-05-12 HISTORY — PX: OPEN REDUCTION INTERNAL FIXATION (ORIF) DISTAL RADIAL FRACTURE: SHX5989

## 2018-05-12 HISTORY — DX: Personal history of Methicillin resistant Staphylococcus aureus infection: Z86.14

## 2018-05-12 HISTORY — DX: Unspecified fracture of the lower end of left radius, initial encounter for closed fracture: S52.502A

## 2018-05-12 SURGERY — OPEN REDUCTION INTERNAL FIXATION (ORIF) DISTAL RADIUS FRACTURE
Anesthesia: Regional | Site: Wrist | Laterality: Left

## 2018-05-12 MED ORDER — CEFAZOLIN SODIUM-DEXTROSE 2-4 GM/100ML-% IV SOLN
2.0000 g | INTRAVENOUS | Status: AC
  Administered 2018-05-12: 1 g via INTRAVENOUS
  Administered 2018-05-12: 2 g via INTRAVENOUS

## 2018-05-12 MED ORDER — CEFAZOLIN SODIUM-DEXTROSE 1-4 GM/50ML-% IV SOLN
INTRAVENOUS | Status: AC
  Filled 2018-05-12: qty 50

## 2018-05-12 MED ORDER — FENTANYL CITRATE (PF) 100 MCG/2ML IJ SOLN: 25.0000 ug | INTRAMUSCULAR | Status: DC | PRN

## 2018-05-12 MED ORDER — FENTANYL CITRATE (PF) 100 MCG/2ML IJ SOLN
INTRAMUSCULAR | Status: AC
  Filled 2018-05-12: qty 2

## 2018-05-12 MED ORDER — MIDAZOLAM HCL 2 MG/2ML IJ SOLN
INTRAMUSCULAR | Status: AC
  Filled 2018-05-12: qty 2

## 2018-05-12 MED ORDER — PROPOFOL 500 MG/50ML IV EMUL
INTRAVENOUS | Status: DC | PRN
  Administered 2018-05-12: 100 ug/kg/min via INTRAVENOUS
  Administered 2018-05-12: 15:00:00 via INTRAVENOUS

## 2018-05-12 MED ORDER — MEPERIDINE HCL 25 MG/ML IJ SOLN: 6.2500 mg | INTRAMUSCULAR | Status: DC | PRN

## 2018-05-12 MED ORDER — CHLORHEXIDINE GLUCONATE 4 % EX LIQD: 60.0000 mL | Freq: Once | CUTANEOUS | Status: DC

## 2018-05-12 MED ORDER — MIDAZOLAM HCL 2 MG/2ML IJ SOLN
1.0000 mg | INTRAMUSCULAR | Status: DC | PRN
  Administered 2018-05-12: 2 mg via INTRAVENOUS

## 2018-05-12 MED ORDER — CEFAZOLIN SODIUM-DEXTROSE 2-4 GM/100ML-% IV SOLN
INTRAVENOUS | Status: AC
  Filled 2018-05-12: qty 100

## 2018-05-12 MED ORDER — SCOPOLAMINE 1 MG/3DAYS TD PT72: 1.0000 | MEDICATED_PATCH | Freq: Once | TRANSDERMAL | Status: DC | PRN

## 2018-05-12 MED ORDER — FENTANYL CITRATE (PF) 100 MCG/2ML IJ SOLN
50.0000 ug | INTRAMUSCULAR | Status: DC | PRN
  Administered 2018-05-12: 100 ug via INTRAVENOUS

## 2018-05-12 MED ORDER — PROMETHAZINE HCL 25 MG/ML IJ SOLN: 6.2500 mg | INTRAMUSCULAR | Status: DC | PRN

## 2018-05-12 MED ORDER — ROPIVACAINE HCL 7.5 MG/ML IJ SOLN
INTRAMUSCULAR | Status: DC | PRN
  Administered 2018-05-12: 40 mL via PERINEURAL

## 2018-05-12 MED ORDER — OXYCODONE HCL 5 MG PO TABS: 5.0000 mg | ORAL_TABLET | Freq: Once | ORAL | Status: DC | PRN

## 2018-05-12 MED ORDER — OXYCODONE-ACETAMINOPHEN 5-325 MG PO TABS: ORAL_TABLET | ORAL | 0 refills | Status: AC

## 2018-05-12 MED ORDER — LACTATED RINGERS IV SOLN
INTRAVENOUS | Status: DC
  Administered 2018-05-12 (×2): via INTRAVENOUS

## 2018-05-12 MED ORDER — LIDOCAINE 2% (20 MG/ML) 5 ML SYRINGE
INTRAMUSCULAR | Status: AC
  Filled 2018-05-12: qty 5

## 2018-05-12 MED ORDER — LIDOCAINE HCL (CARDIAC) PF 100 MG/5ML IV SOSY
PREFILLED_SYRINGE | INTRAVENOUS | Status: DC | PRN
  Administered 2018-05-12: 20 mg via INTRAVENOUS

## 2018-05-12 MED ORDER — ONDANSETRON HCL 4 MG/2ML IJ SOLN
INTRAMUSCULAR | Status: DC | PRN
  Administered 2018-05-12: 4 mg via INTRAVENOUS

## 2018-05-12 MED ORDER — OXYCODONE HCL 5 MG/5ML PO SOLN: 5.0000 mg | Freq: Once | ORAL | Status: DC | PRN

## 2018-05-12 MED ORDER — CLONIDINE HCL (ANALGESIA) 100 MCG/ML EP SOLN
EPIDURAL | Status: DC | PRN
  Administered 2018-05-12: 80 ug

## 2018-05-12 MED ORDER — PROPOFOL 10 MG/ML IV BOLUS
INTRAVENOUS | Status: DC | PRN
  Administered 2018-05-12: 40 mg via INTRAVENOUS
  Administered 2018-05-12 (×3): 20 mg via INTRAVENOUS

## 2018-05-12 MED ORDER — PROPOFOL 10 MG/ML IV BOLUS
INTRAVENOUS | Status: AC
  Filled 2018-05-12: qty 20

## 2018-05-12 MED ORDER — MIDAZOLAM HCL 2 MG/2ML IJ SOLN
INTRAMUSCULAR | Status: DC | PRN
  Administered 2018-05-12: 2 mg via INTRAVENOUS

## 2018-05-12 SURGICAL SUPPLY — 61 items
BANDAGE ACE 3X5.8 VEL STRL LF (GAUZE/BANDAGES/DRESSINGS) ×2 IMPLANT
BIT DRILL 2.0 LNG QUCK RELEASE (BIT) ×1 IMPLANT
BIT DRILL 2.8 QUICK RELEASE (BIT) ×1 IMPLANT
BLADE SURG 15 STRL LF DISP TIS (BLADE) ×2 IMPLANT
BLADE SURG 15 STRL SS (BLADE) ×2
BNDG ESMARK 4X9 LF (GAUZE/BANDAGES/DRESSINGS) ×2 IMPLANT
BNDG GAUZE ELAST 4 BULKY (GAUZE/BANDAGES/DRESSINGS) ×2 IMPLANT
BNDG PLASTER X FAST 3X3 WHT LF (CAST SUPPLIES) ×20 IMPLANT
CHLORAPREP W/TINT 26ML (MISCELLANEOUS) ×2 IMPLANT
CORD BIPOLAR FORCEPS 12FT (ELECTRODE) ×2 IMPLANT
COVER BACK TABLE 60X90IN (DRAPES) ×2 IMPLANT
COVER MAYO STAND STRL (DRAPES) ×2 IMPLANT
COVER WAND RF STERILE (DRAPES) IMPLANT
CUFF TOURNIQUET SINGLE 18IN (TOURNIQUET CUFF) IMPLANT
CUFF TOURNIQUET SINGLE 24IN (TOURNIQUET CUFF) ×2 IMPLANT
DRAPE EXTREMITY T 121X128X90 (DRAPE) ×2 IMPLANT
DRAPE OEC MINIVIEW 54X84 (DRAPES) ×2 IMPLANT
DRAPE SURG 17X23 STRL (DRAPES) ×2 IMPLANT
DRILL 2.0 LNG QUICK RELEASE (BIT) ×2
DRILL 2.8 QUICK RELEASE (BIT) ×2
GAUZE SPONGE 4X4 12PLY STRL (GAUZE/BANDAGES/DRESSINGS) ×2 IMPLANT
GAUZE XEROFORM 1X8 LF (GAUZE/BANDAGES/DRESSINGS) ×2 IMPLANT
GLOVE BIO SURGEON STRL SZ 6.5 (GLOVE) ×2 IMPLANT
GLOVE BIO SURGEON STRL SZ7.5 (GLOVE) ×2 IMPLANT
GLOVE BIOGEL PI IND STRL 7.0 (GLOVE) ×2 IMPLANT
GLOVE BIOGEL PI IND STRL 8 (GLOVE) ×1 IMPLANT
GLOVE BIOGEL PI IND STRL 8.5 (GLOVE) ×1 IMPLANT
GLOVE BIOGEL PI INDICATOR 7.0 (GLOVE) ×2
GLOVE BIOGEL PI INDICATOR 8 (GLOVE) ×1
GLOVE BIOGEL PI INDICATOR 8.5 (GLOVE) ×1
GLOVE ECLIPSE 6.5 STRL STRAW (GLOVE) ×2 IMPLANT
GLOVE SURG ORTHO 8.0 STRL STRW (GLOVE) ×2 IMPLANT
GOWN STRL REUS W/ TWL LRG LVL3 (GOWN DISPOSABLE) ×2 IMPLANT
GOWN STRL REUS W/TWL LRG LVL3 (GOWN DISPOSABLE) ×2
GOWN STRL REUS W/TWL XL LVL3 (GOWN DISPOSABLE) ×4 IMPLANT
GUIDEWIRE ORTHO 0.054X6 (WIRE) ×6 IMPLANT
NEEDLE HYPO 25X1 1.5 SAFETY (NEEDLE) IMPLANT
NS IRRIG 1000ML POUR BTL (IV SOLUTION) ×2 IMPLANT
PACK BASIN DAY SURGERY FS (CUSTOM PROCEDURE TRAY) ×2 IMPLANT
PAD CAST 3X4 CTTN HI CHSV (CAST SUPPLIES) ×1 IMPLANT
PADDING CAST COTTON 3X4 STRL (CAST SUPPLIES) ×1
PLATE PROX NARROW LEFT (Plate) ×2 IMPLANT
SCREW ACTK 2 NL HEX 3.5.11 (Screw) ×2 IMPLANT
SCREW CORT FT 18X2.3XLCK HEX (Screw) ×1 IMPLANT
SCREW CORT FT 20X2.3XLCK HEX (Screw) ×1 IMPLANT
SCREW CORTICAL LOCKING 2.3X16M (Screw) ×2 IMPLANT
SCREW CORTICAL LOCKING 2.3X18M (Screw) ×3 IMPLANT
SCREW CORTICAL LOCKING 2.3X20M (Screw) ×1 IMPLANT
SCREW FX16X2.3XLCK SMTH NS CRT (Screw) ×2 IMPLANT
SCREW FX18X2.3XSMTH LCK NS CRT (Screw) ×2 IMPLANT
SCREW NONLOCK HEX 3.5X12 (Screw) ×4 IMPLANT
SLEEVE SCD COMPRESS KNEE MED (MISCELLANEOUS) IMPLANT
SLING ARM FOAM STRAP LRG (SOFTGOODS) ×2 IMPLANT
STOCKINETTE 4X48 STRL (DRAPES) ×2 IMPLANT
SUT ETHILON 4 0 PS 2 18 (SUTURE) ×2 IMPLANT
SUT VICRYL 4-0 PS2 18IN ABS (SUTURE) ×2 IMPLANT
SYR BULB 3OZ (MISCELLANEOUS) ×2 IMPLANT
SYR CONTROL 10ML LL (SYRINGE) IMPLANT
TOWEL GREEN STERILE FF (TOWEL DISPOSABLE) ×4 IMPLANT
TOWEL OR NON WOVEN STRL DISP B (DISPOSABLE) ×2 IMPLANT
UNDERPAD 30X30 (UNDERPADS AND DIAPERS) ×2 IMPLANT

## 2018-05-12 NOTE — Anesthesia Postprocedure Evaluation (Signed)
Anesthesia Post Note  Patient: Claire Anderson  Procedure(s) Performed: OPEN REDUCTION INTERNAL FIXATION (ORIF) LEFT DISTAL RADIAL FRACTURE (Left Wrist)     Patient location during evaluation: PACU Anesthesia Type: Regional Level of consciousness: awake and alert Pain management: pain level controlled Vital Signs Assessment: post-procedure vital signs reviewed and stable Respiratory status: spontaneous breathing, nonlabored ventilation and respiratory function stable Cardiovascular status: blood pressure returned to baseline and stable Postop Assessment: no apparent nausea or vomiting Anesthetic complications: no    Last Vitals:  Vitals:   05/12/18 1430 05/12/18 1617  BP: 112/66 122/71  Pulse: 68 83  Resp: (!) 23 12  Temp:  36.8 C  SpO2: 99% 97%    Last Pain:  Vitals:   05/12/18 1617  TempSrc:   PainSc: 0-No pain                 Lucretia Kernarolyn E Eathen Budreau

## 2018-05-12 NOTE — Discharge Instructions (Addendum)

## 2018-05-12 NOTE — Op Note (Signed)
I assisted Surgeon(s) and Role:    * Betha LoaKuzma, Kevin, MD - Primary    Cindee Salt* Daissy Yerian, MD - Assisting on the Procedure(s): OPEN REDUCTION INTERNAL FIXATION (ORIF) LEFT DISTAL RADIAL FRACTURE on 05/12/2018.  I provided assistance on this case as follows: setup,identification of the fracture, mobilization of the fracture, debridement of the fracture, reduction, stabilization and fixation of the fracture, followed by closure of the wound and application of the dressing and splints.  Electronically signed by: Cindee SaltGary Shyne Resch, MD Date: 05/12/2018 Time: 4:14 PM

## 2018-05-12 NOTE — Op Note (Signed)
05/12/2018 [facility]  Operative Note  Pre Op Diagnosis: Left comminuted intraarticular distal radius fracture  Post Op Diagnosis: Left comminuted intraarticular distal radius fracture  Procedure: ORIF Left comminuted intraarticular distal radius fracture, 3 intraarticular fragments  Surgeon: Betha LoaKevin Lolly Glaus, MD  Assistant: Cindee SaltGary Eunice Winecoff, MD  Anesthesia: Regional with sedation  Fluids: Per anesthesia flow sheet  EBL: minimal  Complications: None  Specimen: None  Tourniquet Time:  Total Tourniquet Time Documented: Upper Arm (Left) - 66 minutes Total: Upper Arm (Left) - 66 minutes   Disposition: Stable to PACU  INDICATIONS:  Claire Anderson is a 37 y.o. female with a left distal radius fracture.  CT shows diastasis of the joint measuring 4 mm.  We discussed nonoperative and operative treatment options.  She wished to proceed with operative fixation.  Risks, benefits, and alternatives of surgery were discussed including the risk of blood loss; infection; damage to nerves, vessels, tendons, ligaments, bone; failure of surgery; need for additional surgery; complications with wound healing; continued pain; nonunion; malunion; stiffness.  We also discussed the possible need for bone graft and the benefits and risks including the possibility of disease transmission.  She voiced understanding of these risks and elected to proceed.    OPERATIVE COURSE:  After being identified preoperatively by myself, the patient and I agreed upon the procedure and site of procedure.  Surgical site was marked.  The risks, benefits and alternatives of the surgery were reviewed and she wished to proceed.  Surgical consent had been signed.  She was given IV Ancef as preoperative antibiotic prophylaxis.  She was transferred to the operating room and placed on the operating room table in supine position with the Left upper extremity on an armboard. Sedation was induced by the anesthesiologist.  A regional  block had been performed by anesthesia in preoperative holding.  The Left upper extremity was prepped and draped in normal sterile orthopedic fashion.  A surgical pause was performed between the surgeons, anesthesia and operating room staff, and all were in agreement as to the patient, procedure and site of procedure.  Tourniquet at the proximal aspect of the extremity was inflated to 250 mmHg after exsanguination of the limb with an Esmarch bandage.  Standard volar Sherilyn CooterHenry approach was used.  The bipolar electrocautery was used to obtain hemostasis.  The superficial and deep portions of the FCR tendon sheath were incised, and the FCR and FPL were swept ulnarly to protect the palmar cutaneous branch of the median nerve.  The brachioradialis was released at the radial side of the radius.  The pronator quadratus was released and elevated with the periosteal elevator.  The fracture site was identified and cleared of soft tissue interposition and hematoma.  It was reduced under direct visualization.  The volar fragment included most of the volar rim.  This was mobilized proximally and elevated to gain visualization of the articular edge of the fracture.  The fracture site was cleared of any soft tissue and early callus formation.  On radiographs there was also noted to be a dorsal rim fragment.  Once mobilization of the fracture had been obtained with clearance of the fracture site it was reduced under direct visualization.  C-arm was used in AP and lateral projections to ensure appropriate reduction which was the case.  An AcuMed volar distal radial locking plate was selected.  It was secured to the bone with the guidepins.  C-arm was used in AP and lateral projections to ensure appropriate reduction and  position of the hardware and adjustments made as necessary.  Standard AO drilling and measuring technique was used.  A single screw was placed in the slotted hole in the shaft of the plate.  The distal holes were filled  with locking pegs with the exception of the styloid holes, which were filled with locking screws.  The remaining holes in the shaft of the plate were filled with nonlocking screws.  Good purchase was obtained.  C-arm was used in AP, lateral and oblique projections to ensure appropriate reduction and position of hardware, which was the case.  There was no intra-articular penetration of hardware.  The wound was copiously irrigated with sterile saline.  Pronator quadratus was repaired back over top of the plate using 4-0 Vicryl suture.  Vicryl suture was placed in the subcutaneous tissues in an inverted interrupted fashion and the skin was closed with 4-0 nylon in a horizontal mattress fashion.  There was good pronation and supination of the wrist without crepitance.  The wound was then dressed with sterile Xeroform, 4x4s, and wrapped with a Kerlix bandage.  A volar splint was placed and wrapped with Kerlix and Ace bandage.  Tourniquet was deflated at 66 minutes.  Fingertips were pink with brisk capillary refill after deflation of the tourniquet.  Operative drapes were broken down.  The patient was awoken from anesthesia safely.  She was transferred back to the stretcher and taken to the PACU in stable condition.  I will see her back in the office in one week for postoperative followup.  I will give her a prescription for Percocet 5/325 1-2 tabs PO q6 hours prn pain, dispense # 20.    Betha Loa, MD Electronically signed, 05/12/18

## 2018-05-12 NOTE — Anesthesia Preprocedure Evaluation (Signed)
Anesthesia Evaluation  Patient identified by MRN, date of birth, ID band Patient awake    Reviewed: Allergy & Precautions, NPO status , Patient's Chart, lab work & pertinent test results  Airway Mallampati: II  TM Distance: >3 FB Neck ROM: Full    Dental no notable dental hx.    Pulmonary neg pulmonary ROS,    Pulmonary exam normal breath sounds clear to auscultation       Cardiovascular negative cardio ROS Normal cardiovascular exam Rhythm:Regular Rate:Normal     Neuro/Psych negative neurological ROS  negative psych ROS   GI/Hepatic negative GI ROS, Neg liver ROS,   Endo/Other  Morbid obesity  Renal/GU negative Renal ROS     Musculoskeletal negative musculoskeletal ROS (+)   Abdominal (+) + obese,   Peds  Hematology negative hematology ROS (+)   Anesthesia Other Findings   Reproductive/Obstetrics negative OB ROS                             Anesthesia Physical Anesthesia Plan  ASA: II  Anesthesia Plan: Regional   Post-op Pain Management:    Induction: Intravenous  PONV Risk Score and Plan: 2 and Ondansetron, Propofol infusion and Midazolam  Airway Management Planned:   Additional Equipment:   Intra-op Plan:   Post-operative Plan:   Informed Consent: I have reviewed the patients History and Physical, chart, labs and discussed the procedure including the risks, benefits and alternatives for the proposed anesthesia with the patient or authorized representative who has indicated his/her understanding and acceptance.   Dental advisory given  Plan Discussed with: CRNA  Anesthesia Plan Comments:         Anesthesia Quick Evaluation

## 2018-05-12 NOTE — Progress Notes (Signed)
Assisted Dr. Germeroth with left, ultrasound guided, axillary block. Side rails up, monitors on throughout procedure. See vital signs in flow sheet. Tolerated Procedure well. 

## 2018-05-12 NOTE — H&P (Signed)
Claire Anderson is an 37 y.o. female.   Chief Complaint: left wrist fracture HPI: 37 yo female with left wrist fracture.  Initially splinted.  CT shows diastasis at joint of 4 mm.  She wishes to proceed with operative fixation.  Allergies: No Known Allergies  Past Medical History:  Diagnosis Date  . Distal radius fracture, left 04/16/2018  . History of MRSA infection    many years ago, pt. states    Past Surgical History:  Procedure Laterality Date  . CHOLECYSTECTOMY    . TUBAL LIGATION  2003  . WISDOM TOOTH EXTRACTION      Family History: Family History  Problem Relation Age of Onset  . Alcohol abuse Father   . Depression Mother   . Cancer Maternal Aunt        ovarian  . Hypertension Maternal Grandmother   . Hypertension Maternal Grandfather     Social History:   reports that she has never smoked. She has never used smokeless tobacco. She reports that she does not drink alcohol or use drugs.  Medications: No medications prior to admission.    No results found for this or any previous visit (from the past 48 hour(s)).  No results found.   A comprehensive review of systems was negative.  Height 5\' 7"  (1.702 m), weight 131.5 kg, last menstrual period 05/03/2018.  General appearance: alert, cooperative and appears stated age Head: Normocephalic, without obvious abnormality, atraumatic Neck: supple, symmetrical, trachea midline Cardio: regular rate and rhythm Resp: clear to auscultation bilaterally Extremities: Intact sensation and capillary refill all digits.  +epl/fpl/io.  No wounds.  Pulses: 2+ and symmetric Skin: Skin color, texture, turgor normal. No rashes or lesions Neurologic: Grossly normal Incision/Wound: none  Assessment/Plan Left distal radius fracture.  Non operative and operative treatment options have been discussed with the patient and patient wishes to proceed with operative treatment. Risks, benefits, and alternatives of surgery have been  discussed and the patient agrees with the plan of care.   Betha LoaKevin Tauheedah Bok 05/12/2018, 12:32 PM

## 2018-05-12 NOTE — Op Note (Signed)
Intra-operative fluoroscopic images in the AP, lateral, and oblique views were taken and evaluated by myself.  Reduction and hardware placement were confirmed.  There was no intraarticular penetration of permanent hardware.  

## 2018-05-12 NOTE — Anesthesia Procedure Notes (Addendum)
Anesthesia Regional Block: Axillary brachial plexus block   Pre-Anesthetic Checklist: ,, timeout performed, Correct Patient, Correct Site, Correct Laterality, Correct Procedure, Correct Position, site marked, Risks and benefits discussed,  Surgical consent,  Pre-op evaluation,  At surgeon's request and post-op pain management  Laterality: Left  Prep: chloraprep       Needles:  Injection technique: Single-shot  Needle Type: Stimulator Needle - 40     Needle Length: 4cm  Needle Gauge: 22     Additional Needles:   Procedures:,,,, ultrasound used (permanent image in chart),,,,  Narrative:  Start time: 05/12/2018 2:14 PM End time: 05/12/2018 2:24 PM Injection made incrementally with aspirations every 5 mL. Anesthesiologist: Lewie LoronGermeroth, Nataley Bahri, MD  Additional Notes: BP cuff, EKG monitors applied. Sedation begun. Nerve location verified with U/S. Anesthetic injected incrementally, slowly , and after neg aspirations under direct u/s guidance. Good perineural spread. Tolerated well.

## 2018-05-12 NOTE — Transfer of Care (Signed)
Immediate Anesthesia Transfer of Care Note  Patient: Claire Anderson  Procedure(s) Performed: OPEN REDUCTION INTERNAL FIXATION (ORIF) LEFT DISTAL RADIAL FRACTURE (Left Wrist)  Patient Location: PACU  Anesthesia Type:MAC combined with regional for post-op pain  Level of Consciousness: awake, alert , oriented and patient cooperative  Airway & Oxygen Therapy: Patient Spontanous Breathing  Post-op Assessment: Report given to RN and Post -op Vital signs reviewed and stable  Post vital signs: Reviewed and stable  Last Vitals:  Vitals Value Taken Time  BP 122/71 05/12/2018  4:17 PM  Temp    Pulse 82 05/12/2018  4:20 PM  Resp 20 05/12/2018  4:20 PM  SpO2 96 % 05/12/2018  4:20 PM  Vitals shown include unvalidated device data.  Last Pain:  Vitals:   05/12/18 1353  TempSrc: Oral  PainSc: 4       Patients Stated Pain Goal: 4 (74/73/40 3709)  Complications: No apparent anesthesia complications

## 2018-05-14 ENCOUNTER — Encounter (HOSPITAL_BASED_OUTPATIENT_CLINIC_OR_DEPARTMENT_OTHER): Payer: Self-pay | Admitting: Orthopedic Surgery

## 2018-12-19 IMAGING — CT CT WRIST*L* W/O CM
3 of 4 series · 10 of 35 positions shown, 12 images · non-contrast
Comparison: Radiographs of 04/18/2018

CLINICAL DATA: Radial fracture for further characterization.

EXAM:
CT OF THE LEFT WRIST WITHOUT CONTRAST
TECHNIQUE: Multidetector CT imaging was performed according to the standard
protocol. Multiplanar CT image reconstructions were also generated.

[Series 7: axial st · axial · 0.14mm/px · z∈[+42,+98]mm · 2 of 90 slices shown, 3 images]
[im 26/90  soft-tissue]
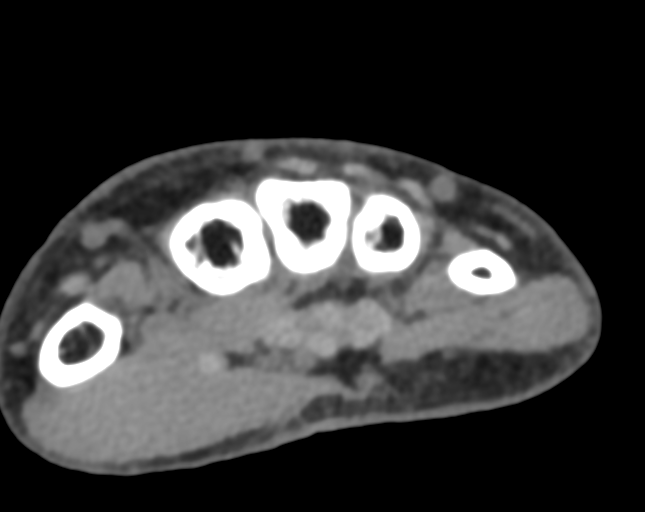
[im 26/90  bone]
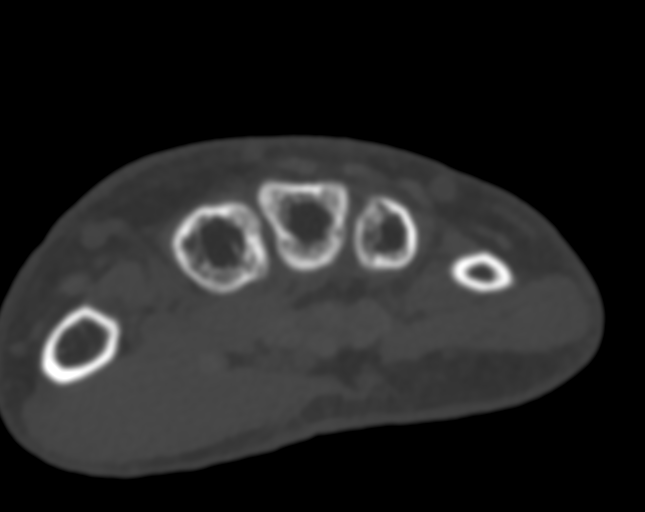
[im 64/90  bone]
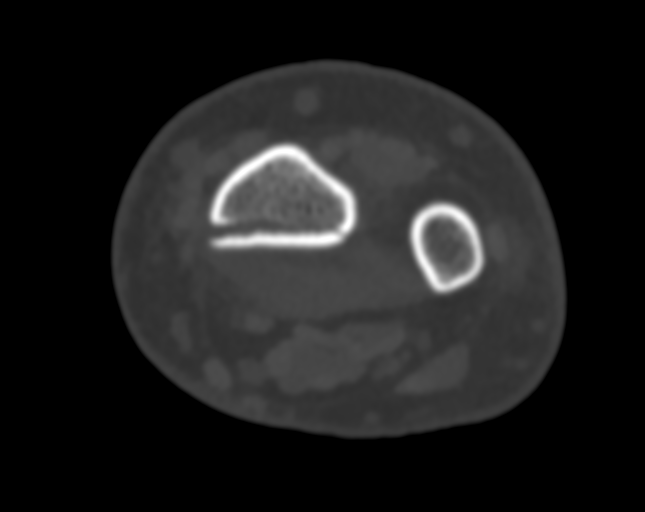

[Series 8: cor st · coronal · 0.20mm/px · 3 of 46 slices shown]
[im 10/46  bone]
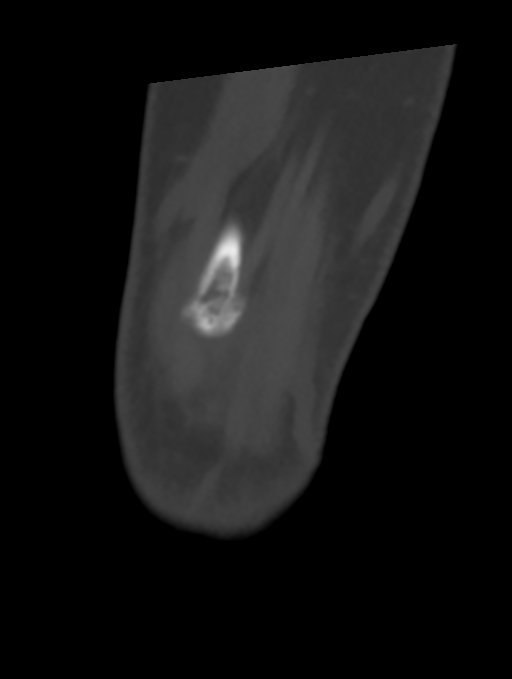
[im 19/46  bone]
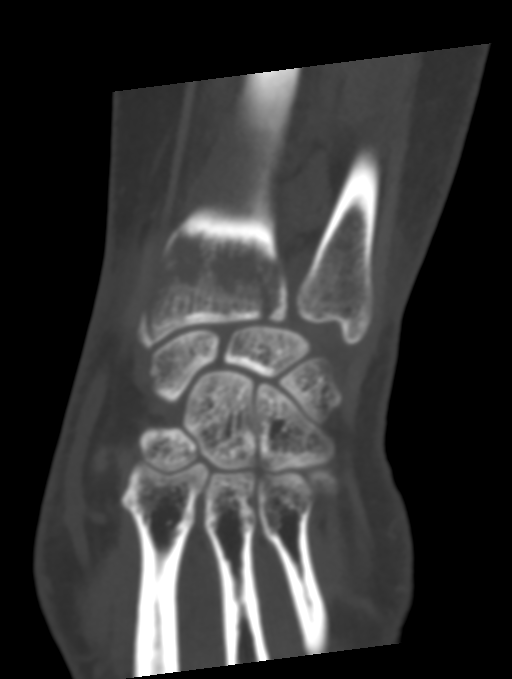
[im 28/46  bone]
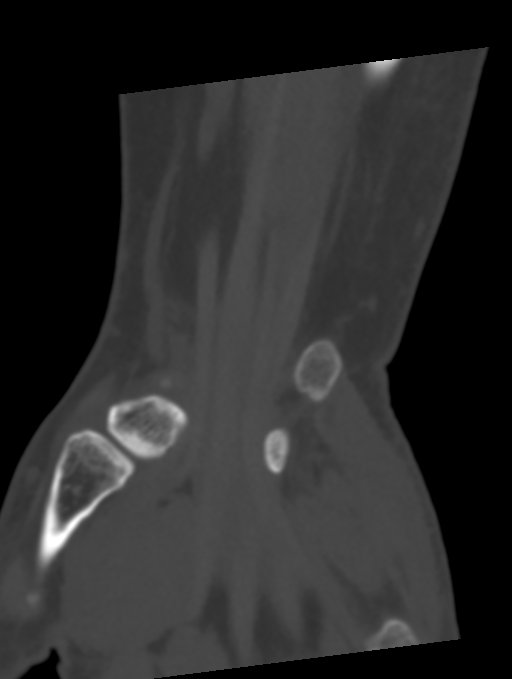

[Series 9: sag st · sagittal · 0.16mm/px · 5 of 66 slices shown, 6 images]
[im 22/66  bone]
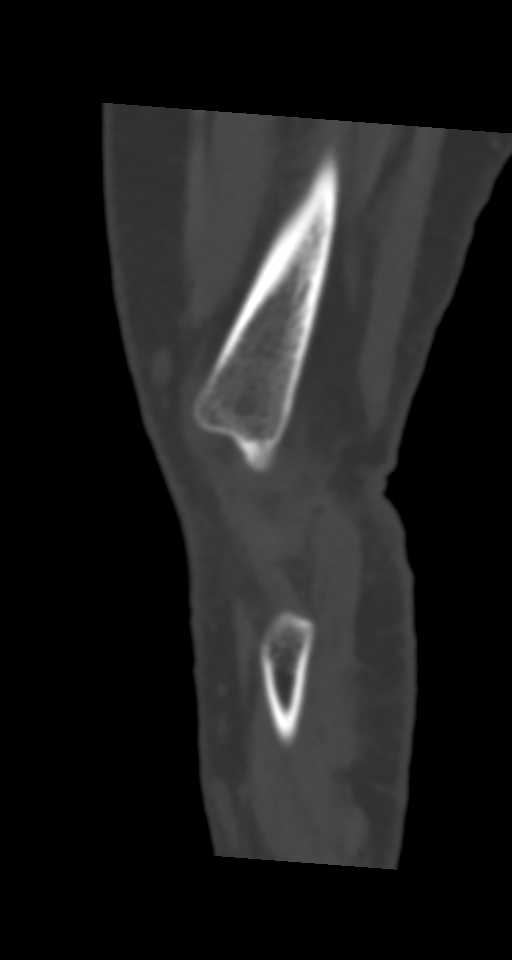
[im 28/66  bone]
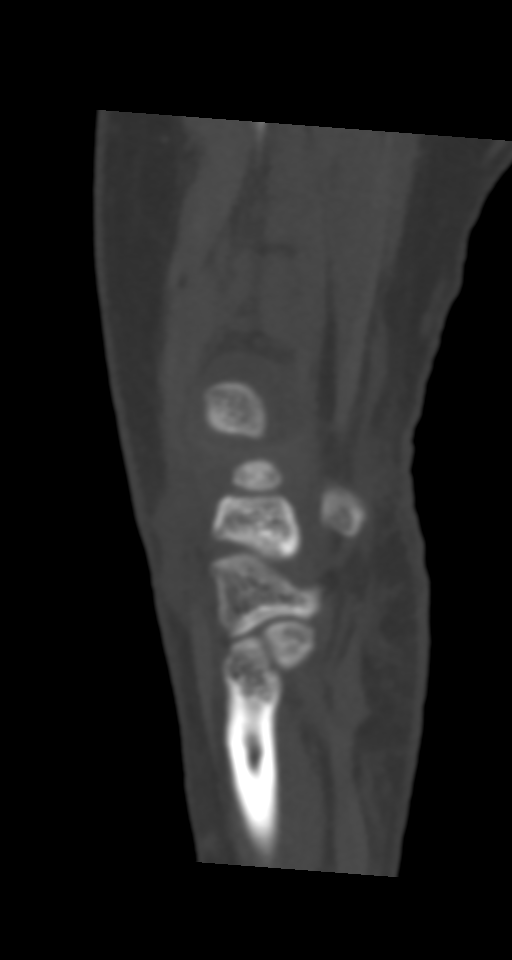
[im 33/66  soft-tissue]
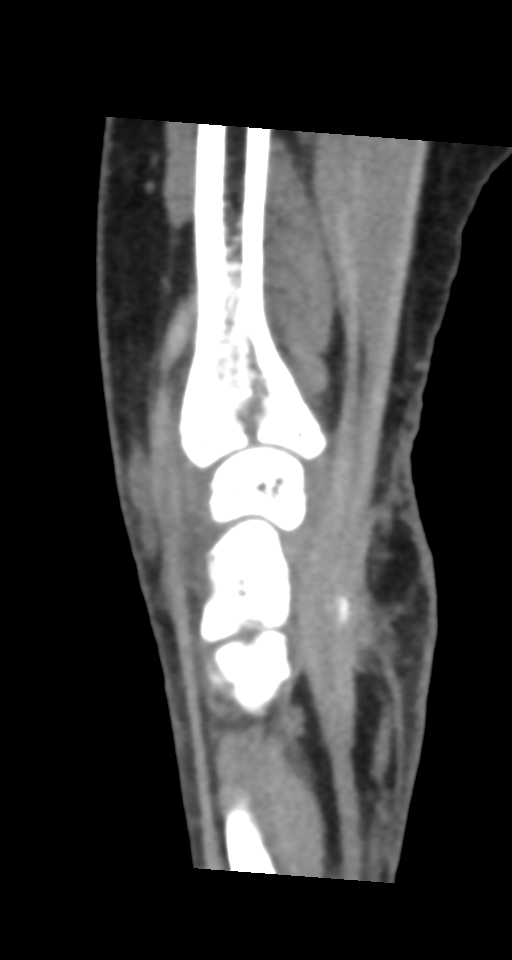
[im 33/66  bone]
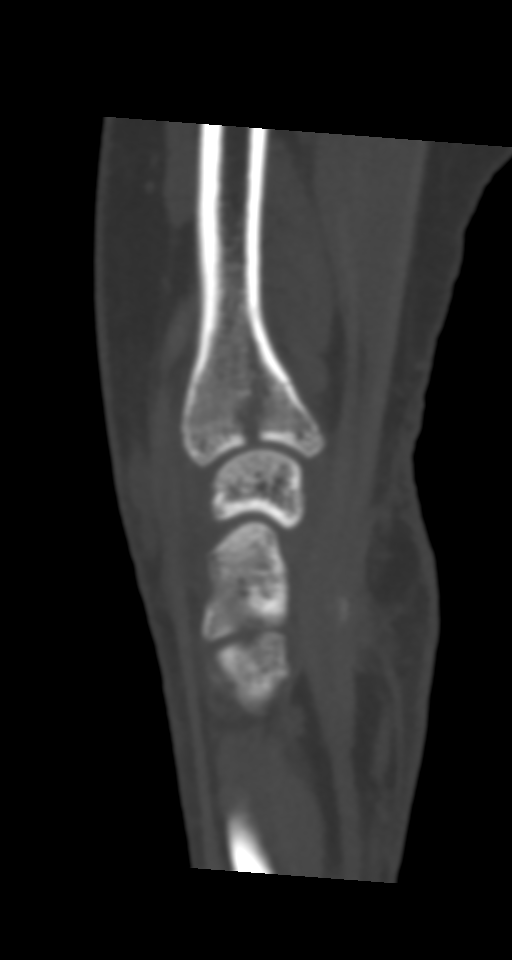
[im 38/66  bone]
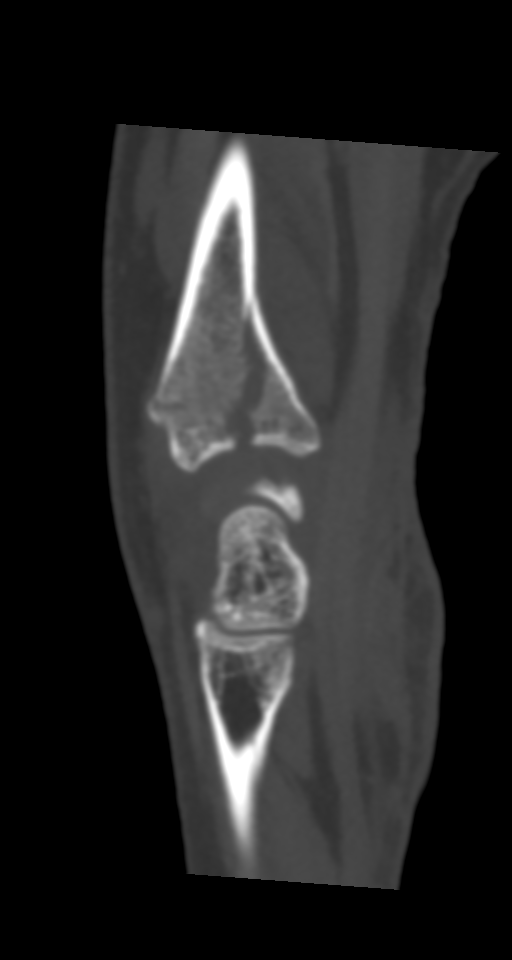
[im 44/66  bone]
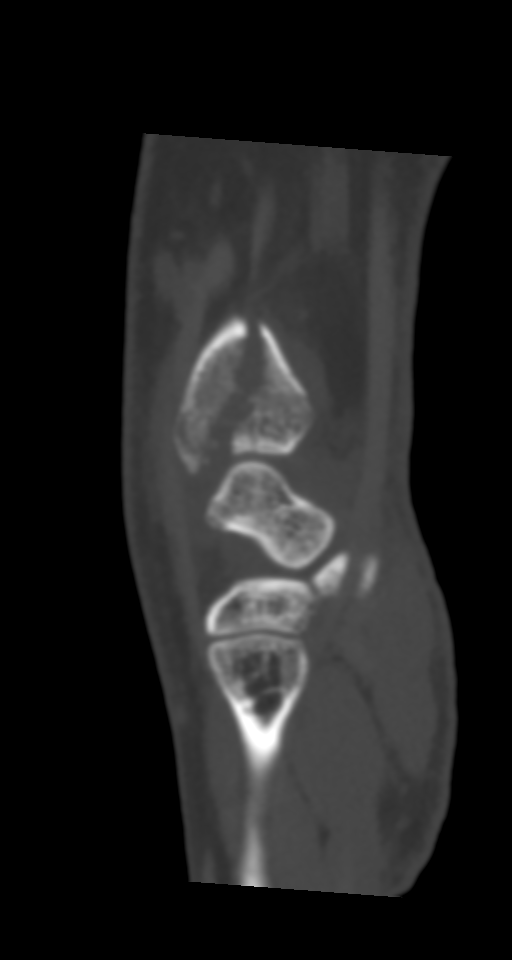

[10 of 35 positions shown; findings below may reference images not displayed]

FINDINGS: Bones/Joint/Cartilage

Mildly comminuted oblique coronally oriented distal radial fracture
extending from the volar metaphysis to the central distal radial
articular surface as shown on image [DATE]. There up to 4 mm of
distraction at the distal articular surface. No cortical fragment
imbedded in the fracture plane.

In addition there is a intersecting transverse fracture of the volar
distal radius as shown on image [DATE], with overlying cortical
buckling and nondisplaced fracture extending to a point about 2.5 mm
proximal to the cortex of the distal articular surface. This
fracture extends into Lister's tubercle.

0.5 by 0.2 cm fragment along the lateral margin of the distal radial
articular surface, image 33/3.

Small avulsion along the posterior margin of the scaphoid on images
26-28 of series 6, likely along the dorsal ligamentous structures.
No other fracture identified. The ulnar styloid appears intact.

Ligaments

Suboptimally assessed by CT.

Muscles and Tendons

There is likely low-grade edema in the carpal tunnel. No entrapment
of the first extensor compartment tendons.

Soft tissues

Mild subcutaneous edema medially and laterally in the wrist.
IMPRESSION: 1. Comminuted distal radial fracture, with the dominant fracture
plane a mildly comminuted coronal obliquely oriented fracture
extending from the volar metaphysis to the central distal radial
articular surface. There up to 4 mm of distraction at the distal
articular surface and medially. Nondisplaced fracture plane
extending from Lister's tubercle into the distal extent of the
dominant coronal fracture. There is also a small separate fragment
along the ulnar side of the distal radial fracture plane, with part
of the TFCC disc connecting to this fragment.
2. Suspected small avulsion of the dorsal scaphoid likely along an
attachment of the dorsal intercarpal ligament.
3. Potential low-grade edema in the carpal tunnel.
4. No tendon entrapment in the fractures.
